# Patient Record
Sex: Female | Born: 1965 | Race: White | Hispanic: No | Marital: Married | Smoking: Former smoker
Health system: Southern US, Community
[De-identification: ages and names within clinical notes are randomized; demographics above are authoritative.]

## PROBLEM LIST (undated history)

## (undated) DIAGNOSIS — T7840XA Allergy, unspecified, initial encounter: Secondary | ICD-10-CM

## (undated) DIAGNOSIS — A071 Giardiasis [lambliasis]: Secondary | ICD-10-CM

## (undated) DIAGNOSIS — M1990 Unspecified osteoarthritis, unspecified site: Secondary | ICD-10-CM

## (undated) DIAGNOSIS — D649 Anemia, unspecified: Secondary | ICD-10-CM

## (undated) DIAGNOSIS — Z803 Family history of malignant neoplasm of breast: Secondary | ICD-10-CM

## (undated) DIAGNOSIS — K589 Irritable bowel syndrome without diarrhea: Secondary | ICD-10-CM

## (undated) DIAGNOSIS — K219 Gastro-esophageal reflux disease without esophagitis: Secondary | ICD-10-CM

## (undated) DIAGNOSIS — K222 Esophageal obstruction: Secondary | ICD-10-CM

## (undated) DIAGNOSIS — K52832 Lymphocytic colitis: Secondary | ICD-10-CM

## (undated) DIAGNOSIS — A498 Other bacterial infections of unspecified site: Secondary | ICD-10-CM

## (undated) HISTORY — DX: Unspecified osteoarthritis, unspecified site: M19.90

## (undated) HISTORY — DX: Allergy, unspecified, initial encounter: T78.40XA

## (undated) HISTORY — DX: Giardiasis (lambliasis): A07.1

## (undated) HISTORY — DX: Anemia, unspecified: D64.9

## (undated) HISTORY — DX: Gastro-esophageal reflux disease without esophagitis: K21.9

## (undated) HISTORY — DX: Family history of malignant neoplasm of breast: Z80.3

## (undated) HISTORY — DX: Other bacterial infections of unspecified site: A49.8

## (undated) HISTORY — DX: Irritable bowel syndrome, unspecified: K58.9

## (undated) HISTORY — DX: Lymphocytic colitis: K52.832

## (undated) HISTORY — DX: Esophageal obstruction: K22.2

---

## 1998-02-14 ENCOUNTER — Other Ambulatory Visit: Admission: RE | Admit: 1998-02-14 | Discharge: 1998-02-14 | Payer: Self-pay | Admitting: Obstetrics and Gynecology

## 2001-10-24 ENCOUNTER — Inpatient Hospital Stay (HOSPITAL_COMMUNITY): Admission: RE | Admit: 2001-10-24 | Discharge: 2001-10-24 | Payer: Self-pay | Admitting: Obstetrics & Gynecology

## 2001-10-24 ENCOUNTER — Encounter: Payer: Self-pay | Admitting: Obstetrics & Gynecology

## 2001-10-25 ENCOUNTER — Inpatient Hospital Stay (HOSPITAL_COMMUNITY): Admission: RE | Admit: 2001-10-25 | Discharge: 2001-10-25 | Payer: Self-pay | Admitting: Obstetrics & Gynecology

## 2001-11-19 ENCOUNTER — Encounter: Payer: Self-pay | Admitting: Obstetrics & Gynecology

## 2001-11-19 ENCOUNTER — Ambulatory Visit (HOSPITAL_COMMUNITY): Admission: RE | Admit: 2001-11-19 | Discharge: 2001-11-19 | Payer: Self-pay | Admitting: Obstetrics & Gynecology

## 2001-11-22 ENCOUNTER — Ambulatory Visit (HOSPITAL_COMMUNITY): Admission: RE | Admit: 2001-11-22 | Discharge: 2001-11-22 | Payer: Self-pay | Admitting: Obstetrics and Gynecology

## 2001-11-22 ENCOUNTER — Encounter: Payer: Self-pay | Admitting: Obstetrics and Gynecology

## 2002-01-31 ENCOUNTER — Inpatient Hospital Stay (HOSPITAL_COMMUNITY): Admission: AD | Admit: 2002-01-31 | Discharge: 2002-01-31 | Payer: Self-pay | Admitting: Obstetrics and Gynecology

## 2002-05-26 ENCOUNTER — Encounter: Admission: RE | Admit: 2002-05-26 | Discharge: 2002-05-26 | Payer: Self-pay | Admitting: *Deleted

## 2002-05-26 ENCOUNTER — Encounter: Payer: Self-pay | Admitting: *Deleted

## 2002-06-17 ENCOUNTER — Inpatient Hospital Stay (HOSPITAL_COMMUNITY): Admission: AD | Admit: 2002-06-17 | Discharge: 2002-06-17 | Payer: Self-pay | Admitting: Obstetrics and Gynecology

## 2002-07-01 DIAGNOSIS — A498 Other bacterial infections of unspecified site: Secondary | ICD-10-CM

## 2002-07-01 HISTORY — DX: Other bacterial infections of unspecified site: A49.8

## 2003-11-22 ENCOUNTER — Inpatient Hospital Stay (HOSPITAL_COMMUNITY): Admission: AD | Admit: 2003-11-22 | Discharge: 2003-11-22 | Payer: Self-pay | Admitting: Obstetrics and Gynecology

## 2003-11-26 ENCOUNTER — Inpatient Hospital Stay (HOSPITAL_COMMUNITY): Admission: AD | Admit: 2003-11-26 | Discharge: 2003-11-26 | Payer: Self-pay | Admitting: Obstetrics & Gynecology

## 2003-11-27 ENCOUNTER — Inpatient Hospital Stay (HOSPITAL_COMMUNITY): Admission: AD | Admit: 2003-11-27 | Discharge: 2003-11-30 | Payer: Self-pay | Admitting: Obstetrics & Gynecology

## 2003-11-27 ENCOUNTER — Encounter (INDEPENDENT_AMBULATORY_CARE_PROVIDER_SITE_OTHER): Payer: Self-pay | Admitting: Specialist

## 2003-12-01 ENCOUNTER — Encounter: Admission: RE | Admit: 2003-12-01 | Discharge: 2003-12-31 | Payer: Self-pay | Admitting: Obstetrics and Gynecology

## 2004-01-31 ENCOUNTER — Encounter: Admission: RE | Admit: 2004-01-31 | Discharge: 2004-03-01 | Payer: Self-pay | Admitting: Obstetrics and Gynecology

## 2004-03-02 ENCOUNTER — Encounter: Admission: RE | Admit: 2004-03-02 | Discharge: 2004-04-01 | Payer: Self-pay | Admitting: Obstetrics and Gynecology

## 2004-05-02 ENCOUNTER — Encounter: Admission: RE | Admit: 2004-05-02 | Discharge: 2004-06-01 | Payer: Self-pay | Admitting: Obstetrics and Gynecology

## 2007-05-04 ENCOUNTER — Encounter: Admission: RE | Admit: 2007-05-04 | Discharge: 2007-05-04 | Payer: Self-pay | Admitting: Family Medicine

## 2007-10-24 ENCOUNTER — Emergency Department (HOSPITAL_COMMUNITY): Admission: EM | Admit: 2007-10-24 | Discharge: 2007-10-24 | Payer: Self-pay | Admitting: Emergency Medicine

## 2007-11-03 ENCOUNTER — Ambulatory Visit: Payer: Self-pay | Admitting: Gastroenterology

## 2007-11-10 ENCOUNTER — Telehealth: Payer: Self-pay | Admitting: Gastroenterology

## 2007-12-01 DIAGNOSIS — R197 Diarrhea, unspecified: Secondary | ICD-10-CM | POA: Insufficient documentation

## 2007-12-01 DIAGNOSIS — K219 Gastro-esophageal reflux disease without esophagitis: Secondary | ICD-10-CM | POA: Insufficient documentation

## 2007-12-01 DIAGNOSIS — K589 Irritable bowel syndrome without diarrhea: Secondary | ICD-10-CM | POA: Insufficient documentation

## 2008-03-08 ENCOUNTER — Ambulatory Visit: Payer: Self-pay | Admitting: Gastroenterology

## 2008-05-30 ENCOUNTER — Ambulatory Visit: Payer: Self-pay | Admitting: Gastroenterology

## 2008-05-30 DIAGNOSIS — K591 Functional diarrhea: Secondary | ICD-10-CM

## 2008-05-31 LAB — CONVERTED CEMR LAB
Sed Rate: 13 mm/hr (ref 0–22)
TSH: 0.82 microintl units/mL (ref 0.35–5.50)

## 2008-06-02 LAB — CONVERTED CEMR LAB: Tissue Transglutaminase Ab, IgA: 0.2 units (ref <7)

## 2008-07-19 ENCOUNTER — Ambulatory Visit: Payer: Self-pay | Admitting: Gastroenterology

## 2008-07-19 ENCOUNTER — Encounter (INDEPENDENT_AMBULATORY_CARE_PROVIDER_SITE_OTHER): Payer: Self-pay

## 2008-08-02 ENCOUNTER — Encounter: Payer: Self-pay | Admitting: Gastroenterology

## 2008-08-02 ENCOUNTER — Ambulatory Visit: Payer: Self-pay | Admitting: Gastroenterology

## 2008-08-03 ENCOUNTER — Telehealth (INDEPENDENT_AMBULATORY_CARE_PROVIDER_SITE_OTHER): Payer: Self-pay

## 2008-08-08 ENCOUNTER — Encounter: Payer: Self-pay | Admitting: Gastroenterology

## 2008-08-11 ENCOUNTER — Encounter: Payer: Self-pay | Admitting: Gastroenterology

## 2008-08-11 ENCOUNTER — Telehealth (INDEPENDENT_AMBULATORY_CARE_PROVIDER_SITE_OTHER): Payer: Self-pay

## 2008-09-02 ENCOUNTER — Ambulatory Visit: Payer: Self-pay | Admitting: Gastroenterology

## 2008-09-02 DIAGNOSIS — K5289 Other specified noninfective gastroenteritis and colitis: Secondary | ICD-10-CM

## 2008-11-11 ENCOUNTER — Encounter: Payer: Self-pay | Admitting: Gastroenterology

## 2008-12-23 ENCOUNTER — Encounter: Payer: Self-pay | Admitting: Gastroenterology

## 2009-03-01 DIAGNOSIS — K222 Esophageal obstruction: Secondary | ICD-10-CM

## 2009-03-01 HISTORY — DX: Esophageal obstruction: K22.2

## 2009-03-13 ENCOUNTER — Telehealth: Payer: Self-pay | Admitting: Gastroenterology

## 2009-03-13 ENCOUNTER — Ambulatory Visit: Payer: Self-pay | Admitting: Gastroenterology

## 2009-03-13 DIAGNOSIS — R110 Nausea: Secondary | ICD-10-CM

## 2009-03-13 DIAGNOSIS — R141 Gas pain: Secondary | ICD-10-CM | POA: Insufficient documentation

## 2009-03-13 DIAGNOSIS — R142 Eructation: Secondary | ICD-10-CM

## 2009-03-13 DIAGNOSIS — R1319 Other dysphagia: Secondary | ICD-10-CM

## 2009-03-13 DIAGNOSIS — R143 Flatulence: Secondary | ICD-10-CM

## 2009-03-15 ENCOUNTER — Encounter: Payer: Self-pay | Admitting: Gastroenterology

## 2009-03-15 ENCOUNTER — Ambulatory Visit: Payer: Self-pay | Admitting: Gastroenterology

## 2009-03-16 ENCOUNTER — Encounter (INDEPENDENT_AMBULATORY_CARE_PROVIDER_SITE_OTHER): Payer: Self-pay

## 2009-03-17 ENCOUNTER — Encounter: Payer: Self-pay | Admitting: Gastroenterology

## 2009-04-11 ENCOUNTER — Ambulatory Visit: Payer: Self-pay | Admitting: Gastroenterology

## 2009-04-11 DIAGNOSIS — K222 Esophageal obstruction: Secondary | ICD-10-CM | POA: Insufficient documentation

## 2009-05-08 ENCOUNTER — Telehealth: Payer: Self-pay | Admitting: Gastroenterology

## 2009-10-24 ENCOUNTER — Emergency Department (HOSPITAL_COMMUNITY): Admission: EM | Admit: 2009-10-24 | Discharge: 2009-10-24 | Payer: Self-pay | Admitting: Emergency Medicine

## 2009-10-25 ENCOUNTER — Telehealth: Payer: Self-pay | Admitting: Gastroenterology

## 2010-07-11 ENCOUNTER — Telehealth: Payer: Self-pay | Admitting: Gastroenterology

## 2010-08-02 NOTE — Progress Notes (Signed)
Summary: Medication refill  Phone Note Call from Patient Call back at Home Phone [phone]   Caller: Patient Call For: Dr.Karee Christopherson Reason for Call: Refill Medication Summary of Call: Needs her Omeprazole refilled...[facility].Marland KitchenMarland KitchenMarland KitchenAppt. sch'd on 08-14-10 Initial call taken by: Karna Christmas,  July 11, 2010 10:10 AM  Follow-up for Phone Call        Rx was sent to pts pharmacy and pt notified to keep her appt for any further refills. Follow-up by: Christie Nottingham CMA Duncan Dull),  July 11, 2010 11:51 AM    Prescriptions: OMEPRAZOLE 20 MG  CPDR (OMEPRAZOLE) 1 each day 30 minutes before meal  #30 x 0   Entered by:   Christie Nottingham CMA (AAMA)   Authorized by:   Meryl Dare MD [facility]   Signed by:   Christie Nottingham CMA (AAMA) on 07/11/2010   Method used:   [facility] [address]       [address]       [place], [place]  [address]       Ph: [phone] or [phone]       Fax: [phone]   RxID:   [id number]

## 2010-08-02 NOTE — Progress Notes (Signed)
Summary: nausea meds  Phone Note Call from Patient Call back at [phone]   Caller: Patient Call For: Dr. Russella Dar Reason for Call: Talk to Nurse Summary of Call: 1. is there anything pt can be rx'ed for nausea... pt reports being on abx for cold and abx has "messed with her stomach"... pt about to be put on another round of abx for an animal bite and worried that nausea will worsen... would like to take a nausea medication while having to take abx 2. also, is there an abx that Dr. Russella Dar would most recommend for pt to take that would affect her GI tract the least that she can suggest to her ER doctor Initial call taken by: Vallarie Mare,  October 25, 2009 9:15 AM  Follow-up for Phone Call        Patient  hasn't started on antibiotics she feels that she will get nauseated when she starts clarithromycin perscribed for the animal bite she got.  She has to see her primary care MD today to have stitches placed to close the bite.  I have advised her she should check with her primary care MD for nausea meds.  She is advised she should take a probiotic while on antibiotics. Follow-up by: Darcey Nora RN, CGRN,  October 25, 2009 9:37 AM  Additional Follow-up for Phone Call Additional follow up Details #1::        Patient  still hasn't started on her clindamycin prescribed by the ER mD for a horse bite.  Patient  is concerned that this will aggrevate her hx of lymphocytic colitis.  She would like a GI MD to review and advise if this is ok to take with her hx of lymphocytic colitis.  Dr Jarold Motto you are MD of the day please advise. Additional Follow-up by: Darcey Nora RN, CGRN,  October 27, 2009 10:14 AM    Additional Follow-up for Phone Call Additional follow up Details #2::    Reviewed with Dr Jarold Motto.  Ok for her to start on her antibiotics.  Patient  is advised to start on Florastor also 2 three times a day while on antibiotics.  patient advised. Follow-up by: Darcey Nora RN, CGRN,  October 27, 2009 10:18  AM

## 2010-08-14 ENCOUNTER — Encounter: Payer: Self-pay | Admitting: Gastroenterology

## 2010-08-14 ENCOUNTER — Ambulatory Visit (INDEPENDENT_AMBULATORY_CARE_PROVIDER_SITE_OTHER): Payer: Self-pay | Admitting: Gastroenterology

## 2010-08-14 DIAGNOSIS — K219 Gastro-esophageal reflux disease without esophagitis: Secondary | ICD-10-CM

## 2010-08-14 DIAGNOSIS — R143 Flatulence: Secondary | ICD-10-CM

## 2010-08-22 NOTE — Assessment & Plan Note (Signed)
Summary: GERD yearly follow-up, med refills   History of Present Illness Visit Type: Follow-up Visit Primary GI MD: Elie Goody MD [facility] Primary Provider: Shary Decamp, MD Requesting Provider: na Chief Complaint: Pt c/o bloating, diarrhea and nausea. Pt needs refill on Omeprazole  History of Present Illness:   Mrs Staiger returns complaining of intermittent problems with nausea, bloating, and diarrhea. Her symptoms are mild. Bloating and gas has responded well to Gas-X. Her reflux symptoms are under excellent control.   GI Review of Systems    Reports bloating and  nausea.      Denies abdominal pain, acid reflux, belching, chest pain, dysphagia with liquids, dysphagia with solids, heartburn, loss of appetite, vomiting, vomiting blood, weight loss, and  weight gain.        Denies anal fissure, black tarry stools, change in bowel habit, constipation, diarrhea, diverticulosis, fecal incontinence, heme positive stool, hemorrhoids, irritable bowel syndrome, jaundice, light color stool, liver problems, rectal bleeding, and  rectal pain.   Current Medications (verified): 1)  Prozac 20 Mg Caps (Fluoxetine Hcl) .Marland Kitchen.. 1 Tablet By Mouth Once Daily 2)  Alprazolam 0.5 Mg Tabs (Alprazolam) .... 1/2 As Needed At Bedtime 3)  Omeprazole 20 Mg  Cpdr (Omeprazole) .Marland Kitchen.. 1 Each Day 30 Minutes Before Meal  Allergies (verified): 1)  Penicillin 2)  Sulfa 3)  Phenergan  Past History:  Past Medical History: GERD Irritable Bowel Syndrome C. Difficile in 2004 Lymphocytic colitis Esophageal stricture, 03/2009  Past Surgical History: Reviewed history from 04/11/2009 and no changes required. C-section x 1  Family History: Reviewed history from 04/11/2009 and no changes required. No FH of Colon Cancer: Family History of Breast Cancer: Maternal Aunt x 2, Maternal Grandmother Family History of Diabetes: Father  Social History: Reviewed history from 04/11/2009 and no changes  required. Married Daily Caffeine Use-2 cups daily Illicit Drug Use - no Patient is a former smoker. -stopped 18 years ago Alcohol Use - yes-4 glasses per week Patient does not get regular exercise.   Review of Systems  The patient denies allergy/sinus, anemia, anxiety-new, arthritis/joint pain, back pain, blood in urine, breast changes/lumps, change in vision, confusion, cough, coughing up blood, depression-new, fainting, fatigue, fever, headaches-new, hearing problems, heart murmur, heart rhythm changes, itching, menstrual pain, muscle pains/cramps, night sweats, nosebleeds, pregnancy symptoms, shortness of breath, skin rash, sleeping problems, sore throat, swelling of feet/legs, swollen lymph glands, thirst - excessive , urination - excessive , urination changes/pain, urine leakage, vision changes, and voice change.    Vital Signs:  Patient profile:   45 year old female Height:      60 inches Weight:      107 pounds BMI:     20.97 BSA:     1.43 Pulse rate:   88 / minute Pulse rhythm:   regular BP sitting:   110 / 64  (left arm) Cuff size:   regular  Vitals Entered By: Ok Anis CMA (August 14, 2010 3:45 PM)  Physical Exam  General:  Well developed, well nourished, no acute distress. Head:  Normocephalic and atraumatic. Eyes:  PERRLA, no icterus. Mouth:  No deformity or lesions, dentition normal. Lungs:  Clear throughout to auscultation. Heart:  Regular rate and rhythm; no murmurs, rubs,  or bruits. Abdomen:  Soft, nontender and nondistended. No masses, hepatosplenomegaly or hernias noted. Normal bowel sounds. Psych:  Alert and cooperative. Normal mood and affect.  Impression & Recommendations:  Problem # 1:  GERD (ICD-530.81) Continue omeprazole 20 mg daily, and standard antireflux measures.  Problem # 2:  IBS (ICD-564.1) Intermittent bloating, nausea and diarrhea, likely related to irritable bowel syndrome. Begin a low gas diet and increase Gas-X to q.i.d. p.r.n. Her  symptoms are not fully controlled consider a probiotic and/or an anti-spasmodic. If her diarrhea worsens consider a relapse of lymphocytic colitis and treat accordingly.  Patient Instructions: 1)  Omeprazole has been sent to your pharmacy.  2)  Start Gas-X four times a day as needed. 3)  Excessive Gas Diet handout given.  4)  Copy sent to : Shary Decamp, MD 5)  The medication list was reviewed and reconciled.  All changed / newly prescribed medications were explained.  A complete medication list was provided to the patient / caregiver.  Prescriptions: OMEPRAZOLE 20 MG  CPDR (OMEPRAZOLE) 1 each day 30 minutes before meal  #30 x 11   Entered by:   Christie Nottingham CMA (AAMA)   Authorized by:   Meryl Dare MD [facility]   Signed by:   Christie Nottingham CMA (AAMA) on 08/14/2010   Method used:   [facility]        [facility]  [place] [address] [facility]       [address]       [place], [place]  [address]       Ph: [phone] or [phone]       Fax: [phone]   RxID:   [id number]

## 2010-11-13 NOTE — Assessment & Plan Note (Signed)
[facility]                         GASTROENTEROLOGY OFFICE NOTE   NAME:Joanne Robertson, Joanne Robertson                       MRN:          [id number]  DATE:11/03/2007                            DOB:          1966/03/12    REFERRING PHYSICIAN:  Duncan Dull, M.D.   REASON FOR CONSULTATION:  Diarrhea.   HISTORY OF PRESENT ILLNESS:  This is a 45 year old white female who  relates a history of irritable bowel syndrome, intermittently  symptomatic since she was a teenager.  She notes diarrhea and abdominal  bloating related to stress.  She has had persistent  problems with  diarrhea for the past six weeks since returning from [place].  She  adopted a child this winter and took two trips to [place].  Approximately  1 month after her returned from her last trip in March, she developed  frequent, watery, nonbloody diarrhea.  She also states she has lost  about 15 pounds over the past 6 months . She has frequent nausea  associated with bloating, belching, acid reflux, and epigastric pain as  well.  Stool studies from October 26, 2007, were negative for enteric  pathogens, ova and parasites, and C. difficile toxins A and B.  A CBC,  metabolic panel, and erythrocyte sedimentation rate from October 23, 2007,  were unremarkable except for a mildly low albumin at 3.3.  She states  she took a course of antibiotics for a sinus infection in February.  She  has not had any dietary changes and notes no particular foods that  exacerbate her symptoms.  Her newly adopted child is a 54-month-old  girl, and she also has a 58-year-old son.  She was started on Nexium with  good control of her reflux symptoms.   FAMILY HISTORY:  Negative for colon cancer, colon polyps, and  inflammatory bowel disease.   PAST MEDICAL HISTORY:  1. C. difficile colitis in 2004.  2. Irritable bowel syndrome.   PAST SURGICAL HISTORY:  Negative.   CURRENT MEDICATIONS:  Listed on the chart, updated, and  reviewed.   MEDICATION ALLERGIES:  PENICILLIN, SULFA, PHENERGAN.   SOCIAL HISTORY:  Per the handwritten form.   REVIEW OF SYSTEMS:  Per the handwritten form.   PHYSICAL EXAMINATION:  GENERAL: Well-developed, well-nourished, mildly  anxious white female.  VITAL SIGNS:  Height 5 feet 1/2 inch, weight 102.6 pounds.  Blood  pressure 94/62, pulse 84 and regular.  HEENT:  Anicteric sclerae.  Oropharynx clear.  CHEST:  Clear to auscultation bilaterally.  CARDIAC:  Regular rate and rhythm without murmurs appreciated.  ABDOMEN:  Soft, nontender, nondistended.  Normoactive bowel sounds.  No  palpable organomegaly, masses, or hernias.  EXTREMITIES:  Without clubbing, cyanosis, or edema.  NEUROLOGIC:  Alert and oriented x3.  Grossly nonfocal.   ASSESSMENT AND PLAN:  1. Diarrhea in the setting of recent antibiotic usage and travel to      [place].  She also has a history compatible with diarrhea-      predominant irritable bowel syndrome, and she may be having a      flare.  We will plan  for an empiric trial of treatment for possible      Clostridium difficile and Giardia with a course of metronidazole      500 mg b.i.d. for 10 days.  If her symptoms do not completely      resolve, we will plan to obtain stool hemoccults and proceed with      colonoscopy for further evaluation.  Return office visit in 3-4      weeks.  2. Gastroesophageal reflux disease.  Maintain standard antireflux      measures and continue Nexium 40 mg p.o. q.a.m.  Return office visit      in 3-4 weeks.     Venita Lick. Russella Dar, MD, [facility]  Electronically Signed    MTS/MedQ  DD: 11/03/2007  DT: 11/03/2007  Job #: [id number]   cc:   Duncan Dull, M.D.

## 2010-11-16 NOTE — Op Note (Signed)
NAME:  Joanne Robertson, Joanne Robertson                          ACCOUNT NO.:  [id number]   MEDICAL RECORD NO.:  [id number]                   PATIENT TYPE:  INP   LOCATION:  9105                                 [facility]ILITY:  [facility]   PHYSICIAN:  Lenoard Aden, M.D.             DATE OF BIRTH:  Apr 04, 1966   DATE OF PROCEDURE:  11/27/2003  DATE OF DISCHARGE:                                 OPERATIVE REPORT   PREOPERATIVE DIAGNOSES:  Active bedrest 40+ weeks, meconium, ruptured  membranes.   POSTOPERATIVE DIAGNOSES:  Active bedrest 40+ weeks, meconium, ruptured  membranes.  Occiput posterior presentation.   PROCEDURE:  Primary low flap transverse cesarean section.   ANESTHESIA:  Epidural.   SURGEON:  Lenoard Aden, M.D.   ASSISTANT:  Richardean Sale, M.D.   ESTIMATED BLOOD LOSS:  800 cc.   COMPLICATIONS:  None.   FINDINGS:  Full-term living female, occiput posterior position.  Meconium  noted.  Apgar's 8 and 9.  DeLee suctioning done.  Normal tubes and ovaries.  Placenta to pathology.  Peds in attendance.   DISPOSITION:  The patient to recovery in good condition.   BRIEF OPERATIVE NOTE:  After being apprised of the risks of anesthesia,  infection, bleeding, injury to intra-abdominal organs and need for repair,  the patient is brought to the operating room.  She was administered an  epidural anesthetic without complications.  Prepped and draped in the usual  sterile fashion.  A Foley catheter previously placed.  After achieving  adequate level from her epidural anesthetic, dilute Marcaine solution is  placed and a Pfannenstiel skin incision is made with the scalpel.  This was  taken down to the fascia, which is nicked in the midline and opened  transversely with the Mayo scissors.  Rectus muscles are dissected sharply  in the midline.  Peritoneum entered bluntly with bladder placed.  Visceroperitoneum scored in a smile-like fashion and dissected sharply off  the lower uterine segment.  Uterus  is scored in a smile-like fashion.   Atraumatic delivery of a full-term living female from occiput posterior  position, with DeLee suctioning performed.  The infant handed to the  awaiting pediatricians in attendance.  Apgar's are 8 and 9.  Cord blood  collected.   Placenta removed from the posterior location and intact.  Three-vessel cord  noted.  The uterus is exteriorized and closed in two layers, after being  curetted with a dry lap pad.  Closed in two layers using a 0 Monocryl  suture.  Hemostasis noted and bladder flap inspected, found to be hemostatic.  The  uterus replaced into the abdominal cavity.  The fascia is closed using 0  Monocryl in continuous running fashion.  Skin closed using staples.   The patient tolerated the procedure well.  She was transferred to the  recovery room in good condition.  Lenoard Aden, M.D.    RJT/MEDQ  D:  11/27/2003  T:  11/27/2003  Job:  [id number]

## 2010-11-16 NOTE — H&P (Signed)
NAME:  Joanne Robertson, Joanne Robertson                          ACCOUNT NO.:  [id number]   MEDICAL RECORD NO.:  [id number]                   PATIENT TYPE:  INP   LOCATION:  9166                                 [facility]ILITY[facility]   PHYSICIAN:  Genia Del, M.D.             DATE OF BIRTH:  06-15-1966   DATE OF ADMISSION:  11/27/2003  DATE OF DISCHARGE:                                HISTORY & PHYSICAL   HISTORY AND PHYSICAL:  Ms. Suitt is a 45 year old G 2, P 0, A 1 at 40 weeks  and 1 day gestation, expected date of delivery Nov 26, 2003.   REASON FOR ADMISSION:  Regular uterine contractions with spontaneous rupture  of membranes at 4:15 a.m.   HISTORY OF PRESENT ILLNESS:  The patient has had prolonged ___________ phase  with frequent regular contractions over the past 48 hours with no cervical  change.  She presented a few times at [facility] Admissions.  Her spontaneous  rupture of membranes was accompanied by a mild amount of fluid that was  slightly tinted green.  Fetal movements positive.  No vaginal bleeding.  No  PIH symptoms.   PAST MEDICAL HISTORY:  Cardiac murmur, antibiotics at dentist, otherwise  negative.   PAST SURGICAL HISTORY:  Positive for extraction of wisdom teeth in 1986.   PAST GYNECOLOGIC HISTORY:  Positive for infertility.   PAST OBSTETRICAL HISTORY:  Spontaneous abortion complete, no complication,  January 2001.   PAST FAMILY HISTORY:  Positive for breast cancer and chronic hypertension.   MEDICATIONS:  Prenatal vitamins.   ALLERGIES:  PENICILLIN and SULFA DRUGS associated with rash.   SOCIAL HISTORY:  Married, nonsmoker.   HISTORY OF PRESENT PREGNANCY:  First trimester normal, hemoglobin 13,  platelets 207, O positive, antibodies negative, toxicology negative, RPR  nonreactive, HGB negative, HIV negative, rubella immune.  First trimester  screening within normal limits, declined amniocentesis for advanced maternal  age.  Ultrasound review of anatomy at 28 weeks  was within normal limits  except for a choroid plexus cyst that was resolved later on ultrasound at  27+ weeks.  One-hour GTT within normal limits.  Blood pressure has remained  normal throughout pregnancy.  Group B strep was negative at 35+ weeks.   REVIEW OF SYSTEMS:  HEENT:  Negative.  CARDIOVASCULAR/RESPIRATORY:  Negative.  GI/UROLOGIC:  Negative.  NEUROLOGIC/ENDOCRINE/DERMATOLOGIC:  Negative.   PHYSICAL EXAMINATION:  GENERAL:  In no apparent distress.  Pain with uterine  contractions.  VITAL SIGNS:  Temperature 97.8, pulse 91, respiratory rate 20, blood  pressure 140/77.  LUNGS:  Clear bilaterally.  HEART:  Regular cardiac rhythm.  PELVIC:  Gravid uterus, cephalic presentation.  Vaginal exam on admission, 1  cm dilated, 90% effaced, vertex minus 2.  Membranes ruptured with tinted  meconium, Nitrazine positive.  NEUROLOGICAL/LYMPHATICS:  Normal.  .  MONITORING:  Fetal heart rate reactive, no deceleration, baseline 140s.  Regular uterine contractions about every 3 minutes,  mild to moderate.   IMPRESSION:  A gravida 2, para 0, abortus 1 at 40 weeks and 1 day gestation  with spontaneous rupture of membranes, amniotic fluid tinted with meconium  in spontaneous labor.  Fetal well being reassuring.  Maternal cardiac murmur  requiring antibiotics.   PLAN:  Admit to Labor and Delivery, continuous monitoring, intrauterine  pressure catheter for amnioinfusion, gentamicin to cover for cardiac murmur.  Expectant management with probable vaginal delivery.                                               Genia Del, M.D.    ML/MEDQ  D:  11/27/2003  T:  11/27/2003  Job:  [id number]

## 2010-11-16 NOTE — Discharge Summary (Signed)
NAME:  Joanne Robertson, Joanne Robertson                          ACCOUNT NO.:  [id number]   MEDICAL RECORD NO.:  [id number]                   PATIENT TYPE:  INP   LOCATION:  9105                                 [facility]   PHYSICIAN:  Lenoard Aden, M.D.             DATE OF BIRTH:  May 06, 1966   DATE OF ADMISSION:  11/27/2003  DATE OF DISCHARGE:  11/30/2003                                 DISCHARGE SUMMARY   The patient underwent an uncomplicated C-section Nov 27, 2003.  Postoperative course uncomplicated.  Discharged to home postop day #3,  tolerated a regular diet well.  Discharge teaching done.  Discharged to home  on medications to include prenatal vitamins, iron, and Percocet.  Follow up  in the office in four to six weeks.                                               Lenoard Aden, M.D.    RJT/MEDQ  D:  01/05/2004  T:  01/06/2004  Job:  [id number]

## 2010-11-16 NOTE — Discharge Summary (Signed)
NAME:  Joanne Robertson, Joanne Robertson                          ACCOUNT NO.:  [id number]   MEDICAL RECORD NO.:  [id number]                   PATIENT TYPE:   LOCATION:                                       [facility]   PHYSICIAN:  Lenoard Aden, M.D.             DATE OF BIRTH:  [birth year]   DATE OF ADMISSION:  DATE OF DISCHARGE:                                 DISCHARGE SUMMARY   INDICATION FOR ADMISSION:  Active labor.   DISCHARGE DIAGNOSES:  1. Active phase arrest.  2. Primary cesarean section.   The patient underwent uncomplicated primary C-section on Nov 27, 2003.  Postoperative course uncomplicated.  Discharged to home postoperative day  #3.  Discharge teaching done.   DISCHARGE MEDICATIONS:  Prenatal vitamins, iron, Tylox.   Follow-up in the office in four weeks.   CONDITION ON DISCHARGE:  Good.  No complications.   FINAL DISCHARGE DIAGNOSES:  1. Active phase arrest.  2. Primary cesarean section.                                               Lenoard Aden, M.D.    RJT/MEDQ  D:  02/18/2004  T:  02/19/2004  Job:  [id number]

## 2011-03-26 LAB — DIFFERENTIAL
Lymphocytes Relative: 42
Lymphs Abs: 2.1
Monocytes Relative: 10
Neutro Abs: 2.4
Neutrophils Relative %: 47

## 2011-03-26 LAB — URINE MICROSCOPIC-ADD ON

## 2011-03-26 LAB — URINALYSIS, ROUTINE W REFLEX MICROSCOPIC
Glucose, UA: NEGATIVE
Hgb urine dipstick: NEGATIVE
Specific Gravity, Urine: 1.025
Specific Gravity, Urine: 1.035 — ABNORMAL HIGH
Urobilinogen, UA: 0.2
Urobilinogen, UA: 1
pH: 6

## 2011-03-26 LAB — POCT I-STAT, CHEM 8
BUN: 8
Chloride: 101
Creatinine, Ser: 1
Glucose, Bld: 84
Hemoglobin: 13.3
Potassium: 3.4 — ABNORMAL LOW
Sodium: 139

## 2011-03-26 LAB — CBC
MCV: 85.1
Platelets: 186
RBC: 4.38
WBC: 5

## 2011-05-15 ENCOUNTER — Ambulatory Visit: Payer: BC Managed Care – PPO | Admitting: Gastroenterology

## 2011-09-09 ENCOUNTER — Other Ambulatory Visit: Payer: Self-pay | Admitting: Gastroenterology

## 2011-09-09 NOTE — Telephone Encounter (Signed)
NEEDS OFFICE VISIT FOR ANY FURTHER REFILLS! 

## 2011-10-17 ENCOUNTER — Other Ambulatory Visit: Payer: Self-pay | Admitting: Gastroenterology

## 2011-10-23 ENCOUNTER — Other Ambulatory Visit: Payer: Self-pay | Admitting: Gastroenterology

## 2011-10-24 ENCOUNTER — Other Ambulatory Visit: Payer: Self-pay | Admitting: Gastroenterology

## 2012-03-10 ENCOUNTER — Encounter: Payer: Self-pay | Admitting: Internal Medicine

## 2012-03-10 ENCOUNTER — Ambulatory Visit (INDEPENDENT_AMBULATORY_CARE_PROVIDER_SITE_OTHER): Payer: BC Managed Care – PPO | Admitting: Internal Medicine

## 2012-03-10 ENCOUNTER — Ambulatory Visit (HOSPITAL_BASED_OUTPATIENT_CLINIC_OR_DEPARTMENT_OTHER)
Admission: RE | Admit: 2012-03-10 | Discharge: 2012-03-10 | Disposition: A | Discharge status: Home / Self Care | Payer: BC Managed Care – PPO | Source: Ambulatory Visit | Attending: Internal Medicine | Admitting: Internal Medicine

## 2012-03-10 ENCOUNTER — Other Ambulatory Visit: Payer: Self-pay | Admitting: Internal Medicine

## 2012-03-10 VITALS — BP 108/64 | HR 83 | Temp 97.2°F | Resp 16 | Ht 60.0 in | Wt 106.3 lb

## 2012-03-10 DIAGNOSIS — M542 Cervicalgia: Secondary | ICD-10-CM

## 2012-03-10 DIAGNOSIS — R05 Cough: Secondary | ICD-10-CM

## 2012-03-10 DIAGNOSIS — Z9189 Other specified personal risk factors, not elsewhere classified: Secondary | ICD-10-CM

## 2012-03-10 DIAGNOSIS — M5030 Other cervical disc degeneration, unspecified cervical region: Secondary | ICD-10-CM | POA: Insufficient documentation

## 2012-03-10 DIAGNOSIS — Z8742 Personal history of other diseases of the female genital tract: Secondary | ICD-10-CM

## 2012-03-10 DIAGNOSIS — J029 Acute pharyngitis, unspecified: Secondary | ICD-10-CM

## 2012-03-10 DIAGNOSIS — Z87898 Personal history of other specified conditions: Secondary | ICD-10-CM

## 2012-03-10 DIAGNOSIS — Z803 Family history of malignant neoplasm of breast: Secondary | ICD-10-CM

## 2012-03-10 DIAGNOSIS — R059 Cough, unspecified: Secondary | ICD-10-CM

## 2012-03-10 DIAGNOSIS — Z9889 Other specified postprocedural states: Secondary | ICD-10-CM

## 2012-03-10 DIAGNOSIS — Z862 Personal history of diseases of the blood and blood-forming organs and certain disorders involving the immune mechanism: Secondary | ICD-10-CM

## 2012-03-10 DIAGNOSIS — R5381 Other malaise: Secondary | ICD-10-CM

## 2012-03-10 DIAGNOSIS — R5383 Other fatigue: Secondary | ICD-10-CM

## 2012-03-10 DIAGNOSIS — A071 Giardiasis [lambliasis]: Secondary | ICD-10-CM | POA: Insufficient documentation

## 2012-03-10 LAB — CBC WITH DIFFERENTIAL/PLATELET
Eosinophils Relative: 3 % (ref 0–5)
Hemoglobin: 12.3 g/dL (ref 12.0–15.0)
Lymphocytes Relative: 43 % (ref 12–46)
Lymphs Abs: 2.4 10*3/uL (ref 0.7–4.0)
MCH: 27.8 pg (ref 26.0–34.0)
MCV: 84.9 fL (ref 78.0–100.0)
Monocytes Relative: 6 % (ref 3–12)
Neutrophils Relative %: 48 % (ref 43–77)
Platelets: 258 10*3/uL (ref 150–400)
RBC: 4.43 MIL/uL (ref 3.87–5.11)
WBC: 5.6 10*3/uL (ref 4.0–10.5)

## 2012-03-10 LAB — COMPREHENSIVE METABOLIC PANEL
ALT: 10 U/L (ref 0–35)
CO2: 27 mEq/L (ref 19–32)
Calcium: 9.7 mg/dL (ref 8.4–10.5)
Chloride: 100 mEq/L (ref 96–112)
Glucose, Bld: 74 mg/dL (ref 70–99)
Sodium: 137 mEq/L (ref 135–145)
Total Protein: 7.7 g/dL (ref 6.0–8.3)

## 2012-03-10 LAB — TSH: TSH: 0.993 u[IU]/mL (ref 0.350–4.500)

## 2012-03-10 MED ORDER — HYDROCORTISONE 2.5 % EX CREA: TOPICAL_CREAM | Freq: Two times a day (BID) | CUTANEOUS | Status: DC

## 2012-03-10 MED ORDER — DOXYCYCLINE HYCLATE 100 MG PO TABS: 100.0000 mg | ORAL_TABLET | Freq: Two times a day (BID) | ORAL | Status: AC

## 2012-03-11 ENCOUNTER — Telehealth: Payer: Self-pay | Admitting: *Deleted

## 2012-03-11 ENCOUNTER — Encounter: Payer: Self-pay | Admitting: Internal Medicine

## 2012-03-11 DIAGNOSIS — Z862 Personal history of diseases of the blood and blood-forming organs and certain disorders involving the immune mechanism: Secondary | ICD-10-CM | POA: Insufficient documentation

## 2012-03-11 DIAGNOSIS — Z87898 Personal history of other specified conditions: Secondary | ICD-10-CM | POA: Insufficient documentation

## 2012-03-11 DIAGNOSIS — Z803 Family history of malignant neoplasm of breast: Secondary | ICD-10-CM | POA: Insufficient documentation

## 2012-03-11 DIAGNOSIS — Z8742 Personal history of other diseases of the female genital tract: Secondary | ICD-10-CM | POA: Insufficient documentation

## 2012-03-11 LAB — VITAMIN D 25 HYDROXY (VIT D DEFICIENCY, FRACTURES): Vit D, 25-Hydroxy: 41 ng/mL (ref 30–89)

## 2012-03-11 NOTE — Telephone Encounter (Signed)
Spoke with pt.  And informed of Xray results.  OK for her to take Ibuprofen 600 mg bid for 14 days then stop  She voices understanding

## 2012-03-11 NOTE — Progress Notes (Signed)
Subjective:    Patient ID: Joanne Robertson, female    DOB: Apr 27, 1966, 46 y.o.   MRN: [id number]  HPI Angelica Chessman is here for first visit. To establish care.  Former primary [employer]  PMH of gerd, S/P esophageal stricture, cervical polyp excision Dr. Billy Coast, abnormal mm 3 years ago with neg biopsy per pt report, and remote history of anemia.    Angelica Chessman has several concerns .  She began with sore throat today and has itchy skin rask near R clavicle and on back.  She is an avid horse rider and is in the woods all the time.  She also recently got a new puppy in the house and startes "I'm not sure if the puppy has fleas."  She denies witnessing and ticks , fleas or spiders. No documented fever or headache.  She has a FH of auto=immune disease in mother with scleroderma and sjogren's.  She reports she had an elevated blood test but not sure what it was -  Possibly sed rate.  She is also concerned over lingering neck discomfort over 2 months ago.  She was horse-back riding and horse reared head and she jerked backwards.  Still has muscle pain.  She is worried about low blood sugar and fatigue  She also has a lingering cough off and on.  Former smoker.  Cough over 3 weeks.    She is current on mammogram done last month at [facility]  .  She reports it was fine  Had pap 8 months ago with Dr. Billy Coast and reports it was normal.  Strong FH of breast cancer in PGM, MGM and Maunts x 2.  She has not had BRCA test due to expense.  Many also reports she is quite stressed at home.  Has 2 children 1 adopted, 1 biologic and spouse not helping too much.  Not sleeping well.  Denies daily depression or anhedonia.  Review of Systems See HPI    Objective:   Physical Exam  Physical Exam  Nursing note and vitals reviewed.  Constitutional: She is oriented to person, place, and time. She appears well-developed and well-nourished.  HENT:  Head: Normocephalic and atraumatic. O/P  Reddened no exudates. Neck good ROM no  swelling Cardiovascular: Normal rate and regular rhythm. Exam reveals no gallop and no friction rub.  No murmur heard.  Pulmonary/Chest: Breath sounds normal. She has no wheezes. She has no rales.  Neurological: She is alert and oriented to person, place, and time.  Skin: Skin is warm and dry.   She has small maculopapular lesions near R clavicle with excorations from scratching.  Few spots on upper scapula. Psychiatric: She has a normal mood and affect. Her behavior is normal.             Assessment & Plan:  Pharyntitis with rash.  In setting of new puppy and frequent woodland and forest exposure,  Will empirically cover for RMSF with Doxycycline 100 mg bid for 10 days  Skin rash  May be insect bites.  HC cream 2.5% bid  Neck pain  Will check C-spine today.  Nsaid prn  Prolonged cough will check CXR    FH breast cancer strong:  Offered BRCA testing she wishes to check with insurance  FH of auto immune disease:  Will need old records.  Get ANA next visit  Situational stress  I gave number to 2 therapists CW and NG pt states she will call  Fatigue  Check chemistries, TSH , CBC  Will follow up with me in 6 weeks

## 2012-03-17 ENCOUNTER — Telehealth: Payer: Self-pay | Admitting: *Deleted

## 2012-03-17 NOTE — Telephone Encounter (Signed)
Message copied by Mathews Robinsons on Tue Mar 17, 2012 11:40 AM ------      Message from: Raechel Chute D      Created: Wed Mar 11, 2012 12:32 PM       Ok to mail to pt

## 2012-03-17 NOTE — Telephone Encounter (Signed)
Mailed lab results to pt

## 2012-04-13 ENCOUNTER — Other Ambulatory Visit: Payer: Self-pay | Admitting: Internal Medicine

## 2012-04-13 NOTE — Telephone Encounter (Signed)
Joanne Robertson  Call this pharmacy and ask if I am the proovider for the Xanax.  I do not see it on her chart

## 2012-04-20 ENCOUNTER — Other Ambulatory Visit: Payer: Self-pay | Admitting: *Deleted

## 2012-04-20 NOTE — Telephone Encounter (Signed)
[facility]. Originally prescribed by patient previous provider Dr. Shary Decamp in [place]. [facility] requested [facility] to Dr. Constance Goltz for refill.

## 2012-04-22 ENCOUNTER — Encounter: Payer: Self-pay | Admitting: Internal Medicine

## 2012-04-22 ENCOUNTER — Ambulatory Visit (INDEPENDENT_AMBULATORY_CARE_PROVIDER_SITE_OTHER): Payer: BC Managed Care – PPO | Admitting: Internal Medicine

## 2012-04-22 VITALS — BP 100/70 | HR 88 | Temp 99.3°F | Resp 18 | Wt 111.0 lb

## 2012-04-22 DIAGNOSIS — Z23 Encounter for immunization: Secondary | ICD-10-CM

## 2012-04-22 DIAGNOSIS — Z733 Stress, not elsewhere classified: Secondary | ICD-10-CM

## 2012-04-22 DIAGNOSIS — F439 Reaction to severe stress, unspecified: Secondary | ICD-10-CM

## 2012-04-22 DIAGNOSIS — Z8269 Family history of other diseases of the musculoskeletal system and connective tissue: Secondary | ICD-10-CM

## 2012-04-22 NOTE — Progress Notes (Signed)
Subjective:    Patient ID: Joanne Robertson, female    DOB: 08-May-1966, 46 y.o.   MRN: [id number]  HPI  Joanne Robertson is here for follow up  Her neck is getting better  Situational stresses  Son Will is in 3rd grade  And may need evaluation for ADD.    Pt is almost sure she has the same issues.  States her husband is more or less removed from issues with the children.    Sleeping OK  Denies depression  Allergies  Allergen Reactions  . Penicillins     REACTION: rash, itching  . Promethazine Hcl     REACTION: agitated  . Sulfonamide Derivatives     REACTION: rash, itching   Past Medical History  Diagnosis Date  . GERD (gastroesophageal reflux disease)   . IBS (irritable bowel syndrome)   . Clostridium difficile infection 2004  . Lymphocytic colitis   . Esophageal stricture 03/2009  . Giardia    Past Surgical History  Procedure Date  . Cesarean section    History   Social History  . Marital Status: Married    Spouse Name: N/A    Number of Children: N/A  . Years of Education: N/A   Occupational History  . Not on file.   Social History Main Topics  . Smoking status: Former Smoker    Types: Cigarettes  . Smokeless tobacco: Never Used  . Alcohol Use: Yes     4 glasses per week  . Drug Use: No  . Sexually Active: Yes   Other Topics Concern  . Not on file   Social History Narrative  . No narrative on file   Family History  Problem Relation Age of Onset  . Breast cancer Maternal Aunt     x 2  . Breast cancer Maternal Grandmother   . Diabetes Father    Patient Active Problem List  Diagnosis  . ESOPHAGEAL STRICTURE  . GERD  . OTH&UNSPEC NONINFECTIOUS GASTROENTERITIS&COLITIS  . IBS  . FUNCTIONAL DIARRHEA  . NAUSEA ALONE  . DYSPHAGIA  . FLATULENCE-GAS-BLOATING  . DIARRHEA  . Giardia  . History of abnormal mammogram  . History of anemia  . History of abnormal Pap smear  . History of cervical polypectomy  . Family history of breast cancer   Current  Outpatient Prescriptions on File Prior to Visit  Medication Sig Dispense Refill  . FLUoxetine (PROZAC) 20 MG tablet Take 20 mg by mouth daily.      . hydrocortisone 2.5 % cream Apply topically 2 (two) times daily.  30 g  0  . omeprazole (PRILOSEC) 20 MG capsule TAKE ONE CAPSULE BY MOUTH EVERY DAY 30 MINUTES BEFORE MEAL  30 capsule  0  . Probiotic Product (PROBIOTIC DAILY PO) Take 1 tablet by mouth daily.           Review of Systems See HPI    Objective:   Physical Exam Physical Exam  Nursing note and vitals reviewed.  Constitutional: She is oriented to person, place, and time. She appears well-developed and well-nourished.  HENT:  Head: Normocephalic and atraumatic.  Cardiovascular: Normal rate and regular rhythm. Exam reveals no gallop and no friction rub.  No murmur heard.  Pulmonary/Chest: Breath sounds normal. She has no wheezes. She has no rales.  Neurological: She is alert and oriented to person, place, and time.  Skin: Skin is warm and dry.  Psychiatric: She has a normal mood and affect. Her behavior is normal.  Assessment & Plan:  Situational stress  I gave number to  Dr. Evelene Croon and Dr. Nolen Mu.  Pt to call  FH auto-immune disorders.   Will check ANA today  Influenza today

## 2012-04-22 NOTE — Patient Instructions (Signed)
Pt to call for psychatirica eval  Dr. Evelene Croon or Dr. Nolen Mu

## 2012-04-28 ENCOUNTER — Telehealth: Payer: Self-pay | Admitting: *Deleted

## 2012-04-28 NOTE — Telephone Encounter (Signed)
Pt notified of additional information regarding BRACA testing

## 2012-04-30 ENCOUNTER — Telehealth: Payer: Self-pay | Admitting: Internal Medicine

## 2012-04-30 DIAGNOSIS — R768 Other specified abnormal immunological findings in serum: Secondary | ICD-10-CM

## 2012-04-30 NOTE — Telephone Encounter (Signed)
Spoke with pt and informed of ANA results.    Will refer to rheumatology

## 2012-05-11 ENCOUNTER — Encounter: Payer: Self-pay | Admitting: Gastroenterology

## 2012-05-11 ENCOUNTER — Encounter: Payer: Self-pay | Admitting: *Deleted

## 2012-05-11 ENCOUNTER — Telehealth: Payer: Self-pay | Admitting: Internal Medicine

## 2012-05-11 NOTE — Telephone Encounter (Signed)
Called pt regarding an appt tomorrow

## 2012-05-11 NOTE — Telephone Encounter (Signed)
Pt states she feels she has Thrush in the mouth... She would like if something can be prescribe or do she has to be seen... Please call her if there are any concerns.Marland KitchenMarland Kitchen

## 2012-05-12 ENCOUNTER — Encounter: Payer: Self-pay | Admitting: Internal Medicine

## 2012-05-12 ENCOUNTER — Ambulatory Visit (INDEPENDENT_AMBULATORY_CARE_PROVIDER_SITE_OTHER): Payer: BC Managed Care – PPO | Admitting: Internal Medicine

## 2012-05-12 VITALS — BP 109/75 | HR 80 | Temp 98.9°F | Resp 18 | Wt 109.0 lb

## 2012-05-12 DIAGNOSIS — K1379 Other lesions of oral mucosa: Secondary | ICD-10-CM

## 2012-05-12 DIAGNOSIS — K1370 Unspecified lesions of oral mucosa: Secondary | ICD-10-CM

## 2012-05-12 NOTE — Patient Instructions (Addendum)
Call me if any worsening

## 2012-05-12 NOTE — Progress Notes (Signed)
Subjective:    Patient ID: Joanne Robertson, female    DOB: February 17, 1966, 46 y.o.   MRN: [id number]  HPI  Angelica Chessman is here for acute visit.  She is wondering if she has thrush.  She felt tingling around her mouth and thought she may have an early fever blister.    She used Abreva   No cough no documented fever   Allergies  Allergen Reactions  . Penicillins     REACTION: rash, itching  . Promethazine Hcl     REACTION: agitated  . Sulfonamide Derivatives     REACTION: rash, itching   Past Medical History  Diagnosis Date  . GERD (gastroesophageal reflux disease)   . IBS (irritable bowel syndrome)   . Clostridium difficile infection 2004  . Lymphocytic colitis   . Esophageal stricture 03/2009  . Giardia    Past Surgical History  Procedure Date  . Cesarean section    History   Social History  . Marital Status: Married    Spouse Name: N/A    Number of Children: N/A  . Years of Education: N/A   Occupational History  . Not on file.   Social History Main Topics  . Smoking status: Former Smoker    Types: Cigarettes  . Smokeless tobacco: Never Used  . Alcohol Use: Yes     Comment: 4 glasses per week  . Drug Use: No  . Sexually Active: Yes   Other Topics Concern  . Not on file   Social History Narrative  . No narrative on file   Family History  Problem Relation Age of Onset  . Breast cancer Maternal Aunt     x 2  . Breast cancer Maternal Grandmother   . Diabetes Father    Patient Active Problem List  Diagnosis  . ESOPHAGEAL STRICTURE  . GERD  . OTH&UNSPEC NONINFECTIOUS GASTROENTERITIS&COLITIS  . IBS  . FUNCTIONAL DIARRHEA  . NAUSEA ALONE  . DYSPHAGIA  . FLATULENCE-GAS-BLOATING  . DIARRHEA  . Giardia  . History of abnormal mammogram  . History of anemia  . History of abnormal Pap smear  . History of cervical polypectomy  . Family history of breast cancer  . ANA positive   Current Outpatient Prescriptions on File Prior to Visit  Medication Sig Dispense  Refill  . ALPRAZolam (XANAX) 0.5 MG tablet Take 0.5 mg by mouth at bedtime as needed.      Marland Kitchen FLUoxetine (PROZAC) 20 MG tablet Take 20 mg by mouth daily.      . hydrocortisone 2.5 % cream Apply topically 2 (two) times daily.  30 g  0  . omeprazole (PRILOSEC) 20 MG capsule TAKE ONE CAPSULE BY MOUTH EVERY DAY 30 MINUTES BEFORE MEAL  30 capsule  0  . Probiotic Product (PROBIOTIC DAILY PO) Take 1 tablet by mouth daily.         Review of Systems    see HPI Objective:   Physical Exam Physical Exam  Nursing note and vitals reviewed.  Constitutional: She is oriented to person, place, and time. She appears well-developed and well-nourished.  HENT:  Careful oropharynx exam.  Dryness on lips  No ulcerations  No lesions indicative of thrush Head: Normocephalic and atraumatic.  Cardiovascular: Normal rate and regular rhythm. Exam reveals no gallop and no friction rub.  No murmur heard.  Pulmonary/Chest: Breath sounds normal. She has no wheezes. She has no rales.  Neurological: She is alert and oriented to person, place, and time.  Skin: Skin is  warm and dry.  Psychiatric: She has a normal mood and affect. Her behavior is normal.       Assessment & Plan:  Possible early viral syndrome.  Advised pt to call if any worsening  No signs of thrush

## 2012-05-15 ENCOUNTER — Telehealth: Payer: Self-pay | Admitting: *Deleted

## 2012-05-15 NOTE — Telephone Encounter (Signed)
Returned pt call left a message

## 2012-05-20 ENCOUNTER — Ambulatory Visit (INDEPENDENT_AMBULATORY_CARE_PROVIDER_SITE_OTHER): Payer: BC Managed Care – PPO | Admitting: Gastroenterology

## 2012-05-20 ENCOUNTER — Encounter: Payer: Self-pay | Admitting: Gastroenterology

## 2012-05-20 VITALS — BP 110/70 | HR 82 | Ht 60.0 in | Wt 109.4 lb

## 2012-05-20 DIAGNOSIS — K219 Gastro-esophageal reflux disease without esophagitis: Secondary | ICD-10-CM

## 2012-05-20 DIAGNOSIS — K589 Irritable bowel syndrome without diarrhea: Secondary | ICD-10-CM

## 2012-05-20 MED ORDER — OMEPRAZOLE 20 MG PO CPDR: DELAYED_RELEASE_CAPSULE | ORAL | Status: DC

## 2012-05-20 NOTE — Patient Instructions (Addendum)
We have sent the following medications to your pharmacy for you to pick up at your convenience:  Omeprazole  

## 2012-05-20 NOTE — Progress Notes (Signed)
History of Present Illness: This is a 46 year old female returning for followup of GERD and irritable bowel syndrome. Her reflux symptoms are very well controlled on standard dietary measures and daily omeprazole. She has rare episodes of mild breakthrough symptoms. She states she has slightly softer or loose her stools once every week or 2. She feels her irritable bowel syndrome is under very good control. Her mother has had several episodes of recurrent C. difficile is now felt to be cured she notes a burning soreness to her lips and mouth for the past 6 weeks without any focal lesions or other abnormalities noted.  Current Medications, Allergies, Past Medical History, Past Surgical History, Family History and Social History were reviewed in [employer] record.  Physical Exam: General: Well developed , well nourished, no acute distress Head: Normocephalic and atraumatic Eyes:  sclerae anicteric, EOMI Ears: Normal auditory acuity Mouth: No deformity or lesions Lungs: Clear throughout to auscultation Heart: Regular rate and rhythm; no murmurs, rubs or bruits Abdomen: Soft, non tender and non distended. No masses, hepatosplenomegaly or hernias noted. Normal Bowel sounds Musculoskeletal: Symmetrical with no gross deformities  Pulses:  Normal pulses noted Extremities: No clubbing, cyanosis, edema or deformities noted Neurological: Alert oriented x 4, grossly nonfocal Psychological:  Alert and cooperative. Normal mood and affect  Assessment and Recommendations:  1. GERD. Continue standard antireflux measures and omeprazole 20 mg daily.   2. Irritable bowel syndrome-D. Symptoms fairly inactive. Continue probiotics.

## 2012-09-21 ENCOUNTER — Other Ambulatory Visit: Payer: Self-pay | Admitting: Internal Medicine

## 2012-09-22 NOTE — Telephone Encounter (Signed)
Refill request

## 2013-01-04 ENCOUNTER — Telehealth: Payer: Self-pay | Admitting: *Deleted

## 2013-01-04 NOTE — Telephone Encounter (Signed)
Joanne Robertson would like a Rx for her ALPRAZolam to help her sleep. She previously received this medication from a prior physician.

## 2013-01-05 MED ORDER — ALPRAZOLAM 0.5 MG PO TABS: ORAL_TABLET | ORAL | Status: DC

## 2013-02-03 ENCOUNTER — Other Ambulatory Visit: Payer: Self-pay | Admitting: Internal Medicine

## 2013-02-03 DIAGNOSIS — Z760 Encounter for issue of repeat prescription: Secondary | ICD-10-CM

## 2013-02-03 MED ORDER — ALPRAZOLAM 0.5 MG PO TABS: ORAL_TABLET | ORAL | Status: DC

## 2013-02-15 ENCOUNTER — Encounter: Payer: Self-pay | Admitting: *Deleted

## 2013-03-21 ENCOUNTER — Other Ambulatory Visit: Payer: Self-pay | Admitting: Internal Medicine

## 2013-03-22 NOTE — Telephone Encounter (Signed)
Refill request

## 2013-05-10 ENCOUNTER — Ambulatory Visit (INDEPENDENT_AMBULATORY_CARE_PROVIDER_SITE_OTHER): Payer: BC Managed Care – PPO | Admitting: Internal Medicine

## 2013-05-10 ENCOUNTER — Encounter: Payer: Self-pay | Admitting: Internal Medicine

## 2013-05-10 VITALS — BP 118/83 | HR 72 | Temp 97.8°F | Resp 18 | Wt 113.0 lb

## 2013-05-10 DIAGNOSIS — Z0000 Encounter for general adult medical examination without abnormal findings: Secondary | ICD-10-CM

## 2013-05-10 DIAGNOSIS — K222 Esophageal obstruction: Secondary | ICD-10-CM

## 2013-05-10 DIAGNOSIS — R768 Other specified abnormal immunological findings in serum: Secondary | ICD-10-CM

## 2013-05-10 DIAGNOSIS — Z803 Family history of malignant neoplasm of breast: Secondary | ICD-10-CM

## 2013-05-10 DIAGNOSIS — Z862 Personal history of diseases of the blood and blood-forming organs and certain disorders involving the immune mechanism: Secondary | ICD-10-CM

## 2013-05-10 DIAGNOSIS — Z23 Encounter for immunization: Secondary | ICD-10-CM

## 2013-05-10 DIAGNOSIS — R894 Abnormal immunological findings in specimens from other organs, systems and tissues: Secondary | ICD-10-CM

## 2013-05-10 DIAGNOSIS — K219 Gastro-esophageal reflux disease without esophagitis: Secondary | ICD-10-CM

## 2013-05-10 LAB — POCT URINALYSIS DIPSTICK
Bilirubin, UA: NEGATIVE
Blood, UA: NEGATIVE
Glucose, UA: NEGATIVE
Ketones, UA: NEGATIVE
Leukocytes, UA: NEGATIVE
Nitrite, UA: NEGATIVE
Protein, UA: NEGATIVE
Spec Grav, UA: 1.01
Urobilinogen, UA: NEGATIVE
pH, UA: 6

## 2013-05-10 LAB — CBC WITH DIFFERENTIAL/PLATELET
Eosinophils Absolute: 0.2 10*3/uL (ref 0.0–0.7)
Lymphocytes Relative: 47 % — ABNORMAL HIGH (ref 12–46)
Lymphs Abs: 2.3 10*3/uL (ref 0.7–4.0)
MCH: 25.2 pg — ABNORMAL LOW (ref 26.0–34.0)
Neutro Abs: 2 10*3/uL (ref 1.7–7.7)
Neutrophils Relative %: 41 % — ABNORMAL LOW (ref 43–77)
Platelets: 279 10*3/uL (ref 150–400)
RBC: 4.53 MIL/uL (ref 3.87–5.11)
WBC: 4.8 10*3/uL (ref 4.0–10.5)

## 2013-05-10 NOTE — Progress Notes (Signed)
Subjective:    Patient ID: Joanne Robertson, female    DOB: [birth year], 47 y.o.   MRN: [id number]  HPI  Angelica Chessman is here for CPE  Overall doing well  She is ana positive and is seeing a rheumatologist in [place].  Dr. Imogene Burn  Had recent mm  Strong FH of breast cancer and I advised BRCA test but she cannot afford  She is wondering if she has low BP at times.  Feels shaky when  Due to eat.    Allergies  Allergen Reactions  . Penicillins     REACTION: rash, itching  . Promethazine Hcl     REACTION: agitated  . Sulfonamide Derivatives     REACTION: rash, itching   Past Medical History  Diagnosis Date  . GERD (gastroesophageal reflux disease)   . IBS (irritable bowel syndrome)   . Clostridium difficile infection 2004  . Lymphocytic colitis   . Esophageal stricture 03/2009  . Giardia    Past Surgical History  Procedure Laterality Date  . Cesarean section     History   Social History  . Marital Status: Married    Spouse Name: N/A    Number of Children: N/A  . Years of Education: N/A   Occupational History  . Not on file.   Social History Main Topics  . Smoking status: Former Smoker    Types: Cigarettes  . Smokeless tobacco: Never Used  . Alcohol Use: Yes     Comment: 4 glasses per week  . Drug Use: No  . Sexual Activity: No   Other Topics Concern  . Not on file   Social History Narrative  . No narrative on file   Family History  Problem Relation Age of Onset  . Breast cancer Maternal Aunt     x 2  . Breast cancer Maternal Grandmother   . Diabetes Father    Patient Active Problem List   Diagnosis Date Noted  . ANA positive 04/30/2012  . History of abnormal mammogram 03/11/2012  . History of anemia 03/11/2012  . History of abnormal Pap smear 03/11/2012  . History of cervical polypectomy 03/11/2012  . Family history of breast cancer 03/11/2012  . Giardia   . ESOPHAGEAL STRICTURE 04/11/2009  . NAUSEA ALONE 03/13/2009  . DYSPHAGIA 03/13/2009  .  FLATULENCE-GAS-BLOATING 03/13/2009  . OTH&UNSPEC NONINFECTIOUS GASTROENTERITIS&COLITIS 09/02/2008  . FUNCTIONAL DIARRHEA 05/30/2008  . GERD 12/01/2007  . IBS 12/01/2007  . DIARRHEA 12/01/2007   Current Outpatient Prescriptions on File Prior to Visit  Medication Sig Dispense Refill  . ALPRAZolam (XANAX) 0.5 MG tablet Take one tablet 3 times a week prn for insomnia  20 tablet  0  . FLUoxetine (PROZAC) 20 MG capsule TAKE ONE CAPSULE BY MOUTH EVERY MORNING  90 capsule  1  . omeprazole (PRILOSEC) 20 MG capsule TAKE ONE CAPSULE BY MOUTH EVERY DAY 30 MINUTES BEFORE MEAL  90 capsule  3  . Probiotic Product (PROBIOTIC DAILY PO) Take 1 tablet by mouth daily.       No current facility-administered medications on file prior to visit.      Review of Systems     Objective:   Physical Exam  Physical Exam  Nursing note and vitals reviewed.  Constitutional: She is oriented to person, place, and time. She appears well-developed and well-nourished.  HENT:  Head: Normocephalic and atraumatic.  Right Ear: Tympanic membrane and ear canal normal. No drainage. Tympanic membrane is not injected and not erythematous.  Left Ear: Tympanic membrane  and ear canal normal. No drainage. Tympanic membrane is not injected and not erythematous.  Nose: Nose normal. Right sinus exhibits no maxillary sinus tenderness and no frontal sinus tenderness. Left sinus exhibits no maxillary sinus tenderness and no frontal sinus tenderness.  Mouth/Throat: Oropharynx is clear and moist. No oral lesions. No oropharyngeal exudate.  Eyes: Conjunctivae and EOM are normal. Pupils are equal, round, and reactive to light.  Neck: Normal range of motion. Neck supple. No JVD present. Carotid bruit is not present. No mass and no thyromegaly present.  Cardiovascular: Normal rate, regular rhythm, S1 normal, S2 normal and intact distal pulses. Exam reveals no gallop and no friction rub.  No murmur heard.  Pulses:  Carotid pulses are 2+ on  the right side, and 2+ on the left side.  Dorsalis pedis pulses are 2+ on the right side, and 2+ on the left side.  No carotid bruit. No LE edema  Pulmonary/Chest: Breath sounds normal. She has no wheezes. She has no rales. She exhibits no tenderness.  Abdominal: Soft. Bowel sounds are normal. She exhibits no distension and no mass. There is no hepatosplenomegaly. There is no tenderness. There is no CVA tenderness.  Musculoskeletal: Normal range of motion.  No active synovitis to joints.  Lymphadenopathy:  She has no cervical adenopathy.  She has no axillary adenopathy.  Right: No inguinal and no supraclavicular adenopathy present.  Left: No inguinal and no supraclavicular adenopathy present.  Neurological: She is alert and oriented to person, place, and time. She has normal strength and normal reflexes. She displays no tremor. No cranial nerve deficit or sensory deficit. Coordination and gait normal.  Skin: Skin is warm and dry. No rash noted. No cyanosis. Nails show no clubbing.  Psychiatric: She has a normal mood and affect. Her speech is normal and behavior is normal. Cognition and memory are normal.          Assessment & Plan:  Health maintenance:    Flu vaccine today  Pap per GYn  Had mm    See scanned sheet  Positive ANA  Rheumatology is following  Shaking spells:   Advised to obtain  Glucometer and check when she feels shaky  GERD:  Continue meds  FH breast CA  Advised bRCA but cannot afford.   Cervical polyp

## 2013-05-10 NOTE — Patient Instructions (Signed)
Sign up for My chart if not done already  Normal blood sugar is 70-125  Normal  BP is top number less that 140  Bottom number is less than 90 on a consistent basis when checked.  BP goes up with exercise and stress  See me as needed  Get blood drawn today    Your received your flu vaccine today

## 2013-05-11 LAB — COMPREHENSIVE METABOLIC PANEL
Albumin: 4.5 g/dL (ref 3.5–5.2)
CO2: 29 mEq/L (ref 19–32)
Calcium: 9.7 mg/dL (ref 8.4–10.5)
Chloride: 100 mEq/L (ref 96–112)
Glucose, Bld: 88 mg/dL (ref 70–99)
Potassium: 4.2 mEq/L (ref 3.5–5.3)
Sodium: 137 mEq/L (ref 135–145)
Total Protein: 7.8 g/dL (ref 6.0–8.3)

## 2013-05-11 LAB — LIPID PANEL
Cholesterol: 202 mg/dL — ABNORMAL HIGH (ref 0–200)
Total CHOL/HDL Ratio: 2.5 Ratio

## 2013-05-11 LAB — VITAMIN D 25 HYDROXY (VIT D DEFICIENCY, FRACTURES): Vit D, 25-Hydroxy: 53 ng/mL (ref 30–89)

## 2013-05-13 ENCOUNTER — Telehealth: Payer: Self-pay | Admitting: Internal Medicine

## 2013-05-13 NOTE — Telephone Encounter (Signed)
Left message on mobile for pt to call office regarding lab results

## 2013-05-14 ENCOUNTER — Telehealth: Payer: Self-pay | Admitting: *Deleted

## 2013-05-17 ENCOUNTER — Other Ambulatory Visit: Payer: Self-pay | Admitting: Internal Medicine

## 2013-05-17 MED ORDER — INTEGRA 62.5-62.5-40-3 MG PO CAPS: 1.0000 | ORAL_CAPSULE | Freq: Every day | ORAL | Status: DC

## 2013-05-17 NOTE — Telephone Encounter (Signed)
Left message for pt to call office regarding labs  Monday 11/17 0820

## 2013-05-18 ENCOUNTER — Encounter: Payer: Self-pay | Admitting: *Deleted

## 2013-05-18 NOTE — Telephone Encounter (Signed)
Refill request

## 2013-05-19 NOTE — Telephone Encounter (Signed)
Called in [facility]anax to [facility]

## 2013-05-26 ENCOUNTER — Other Ambulatory Visit: Payer: Self-pay | Admitting: Internal Medicine

## 2013-05-31 NOTE — Telephone Encounter (Signed)
Refill request

## 2013-06-13 ENCOUNTER — Other Ambulatory Visit: Payer: Self-pay | Admitting: Gastroenterology

## 2013-06-14 NOTE — Telephone Encounter (Signed)
NEEDS OFFICE VISIT FOR ANY FURTHER REFILLS! 

## 2013-06-25 ENCOUNTER — Other Ambulatory Visit: Payer: Self-pay | Admitting: Gastroenterology

## 2013-07-01 HISTORY — PX: COLONOSCOPY: SHX174

## 2013-07-02 ENCOUNTER — Other Ambulatory Visit: Payer: Self-pay | Admitting: Internal Medicine

## 2013-07-20 NOTE — Telephone Encounter (Signed)
[facility] 

## 2013-08-16 ENCOUNTER — Telehealth: Payer: Self-pay | Admitting: *Deleted

## 2013-08-16 MED ORDER — ACETAZOLAMIDE 250 MG PO TABS: ORAL_TABLET | ORAL | Status: DC

## 2013-08-16 NOTE — Telephone Encounter (Signed)
Joanne Robertson wants Rx for Diamox to prevent altitude sickness on her trip.

## 2013-08-30 ENCOUNTER — Encounter: Payer: Self-pay | Admitting: Internal Medicine

## 2013-08-30 ENCOUNTER — Ambulatory Visit (INDEPENDENT_AMBULATORY_CARE_PROVIDER_SITE_OTHER): Payer: BC Managed Care – PPO | Admitting: Internal Medicine

## 2013-08-30 VITALS — BP 110/69 | HR 82 | Temp 98.5°F | Resp 16 | Wt 112.0 lb

## 2013-08-30 DIAGNOSIS — R059 Cough, unspecified: Secondary | ICD-10-CM

## 2013-08-30 DIAGNOSIS — J209 Acute bronchitis, unspecified: Secondary | ICD-10-CM

## 2013-08-30 DIAGNOSIS — R05 Cough: Secondary | ICD-10-CM

## 2013-08-30 DIAGNOSIS — J029 Acute pharyngitis, unspecified: Secondary | ICD-10-CM

## 2013-08-30 MED ORDER — HYDROCOD POLST-CHLORPHEN POLST 10-8 MG/5ML PO LQCR: 5.0000 mL | Freq: Two times a day (BID) | ORAL | Status: DC | PRN

## 2013-08-30 MED ORDER — AZITHROMYCIN 250 MG PO TABS: ORAL_TABLET | ORAL | Status: DC

## 2013-08-30 NOTE — Progress Notes (Signed)
Subjective:    Patient ID: Joanne Robertson, female    DOB: [birth year], 48 y.o.   MRN: [id number]  HPI  Sore throat several days cough 2 weeks productive white sputum no fever no SOB no chest pain.  Just returned from wedding in [place].  Son sick as well.    Allergies  Allergen Reactions  . Penicillins     REACTION: rash, itching  . Promethazine Hcl     REACTION: agitated  . Sulfonamide Derivatives     REACTION: rash, itching   Past Medical History  Diagnosis Date  . GERD (gastroesophageal reflux disease)   . IBS (irritable bowel syndrome)   . Clostridium difficile infection 2004  . Lymphocytic colitis   . Esophageal stricture 03/2009  . Giardia    Past Surgical History  Procedure Laterality Date  . Cesarean section     History   Social History  . Marital Status: Married    Spouse Name: N/A    Number of Children: N/A  . Years of Education: N/A   Occupational History  . Not on file.   Social History Main Topics  . Smoking status: Former Smoker    Types: Cigarettes  . Smokeless tobacco: Never Used  . Alcohol Use: Yes     Comment: 4 glasses per week  . Drug Use: No  . Sexual Activity: No   Other Topics Concern  . Not on file   Social History Narrative  . No narrative on file   Family History  Problem Relation Age of Onset  . Breast cancer Maternal Aunt     x 2  . Breast cancer Maternal Grandmother   . Diabetes Father    Patient Active Problem List   Diagnosis Date Noted  . ANA positive 04/30/2012  . History of abnormal mammogram 03/11/2012  . History of anemia 03/11/2012  . History of abnormal Pap smear 03/11/2012  . History of cervical polypectomy 03/11/2012  . Family history of breast cancer 03/11/2012  . Giardia   . ESOPHAGEAL STRICTURE 04/11/2009  . NAUSEA ALONE 03/13/2009  . DYSPHAGIA 03/13/2009  . FLATULENCE-GAS-BLOATING 03/13/2009  . OTH&UNSPEC NONINFECTIOUS GASTROENTERITIS&COLITIS 09/02/2008  . FUNCTIONAL DIARRHEA 05/30/2008  . GERD  12/01/2007  . IBS 12/01/2007  . DIARRHEA 12/01/2007   Current Outpatient Prescriptions on File Prior to Visit  Medication Sig Dispense Refill  . ALPRAZolam (XANAX) 0.5 MG tablet TAKE 1 TABLET BY MOUTH DAILY AS NEEDED  30 tablet  0  . Fe Fum-FePoly-Vit C-Vit B3 (INTEGRA) 62.5-62.5-40-3 MG CAPS Take 1 capsule by mouth daily.  30 capsule  5  . FLUoxetine (PROZAC) 20 MG capsule TAKE ONE CAPSULE BY MOUTH EVERY MORNING  90 capsule  1  . omeprazole (PRILOSEC) 20 MG capsule TAKE ONE CAPSULE BY MOUTH EVERY DAY 30 MINUTES BEFORE MEAL  90 capsule  0  . Probiotic Product (PROBIOTIC DAILY PO) Take 1 tablet by mouth daily.      Marland Kitchen acetaZOLAMIDE (DIAMOX) 250 MG tablet Take one tablet twice a day  One day before trip. Continue bid for two days total  4 tablet  0   No current facility-administered medications on file prior to visit.      Review of Systems See HPI    Objective:   Physical Exam Physical Exam  Nursing note and vitals reviewed.  Constitutional: She is oriented to person, place, and time. She appears well-developed and well-nourished. She is cooperative.  HENT:  Head: Normocephalic and atraumatic. O/P erythema  No exudates  Nose: Mucosal edema present.  Eyes: Conjunctivae and EOM are normal. Pupils are equal, round, and reactive to light.  Neck: Neck supple.  Cardiovascular: Regular rhythm, normal heart sounds, intact distal pulses and normal pulses. Exam reveals no gallop and no friction rub.  No murmur heard.  Pulmonary/Chest: She has no wheezes. She has rhonchi. She has no rales.  Neurological: She is alert and oriented to person, place, and time.  Skin: Skin is warm and dry. No abrasion, no bruising, no ecchymosis and no rash noted. No cyanosis. Nails show no clubbing.  Psychiatric: She has a normal mood and affect. Her speech is normal and behavior is normal.            Assessment & Plan:  Pharyngitis   Z-pak  [place]ough  Tussioness  Bronchitis see above

## 2013-09-27 ENCOUNTER — Telehealth: Payer: Self-pay | Admitting: Oncology

## 2013-09-27 ENCOUNTER — Other Ambulatory Visit: Payer: Self-pay | Admitting: Gastroenterology

## 2013-09-27 NOTE — Telephone Encounter (Signed)
C/D 09/27/13 appt. 10/14/13

## 2013-10-08 ENCOUNTER — Other Ambulatory Visit: Payer: Self-pay | Admitting: Oncology

## 2013-10-08 DIAGNOSIS — Z862 Personal history of diseases of the blood and blood-forming organs and certain disorders involving the immune mechanism: Secondary | ICD-10-CM

## 2013-10-14 ENCOUNTER — Ambulatory Visit (HOSPITAL_BASED_OUTPATIENT_CLINIC_OR_DEPARTMENT_OTHER): Payer: BC Managed Care – PPO | Admitting: Oncology

## 2013-10-14 ENCOUNTER — Ambulatory Visit: Payer: BC Managed Care – PPO

## 2013-10-14 ENCOUNTER — Encounter: Payer: Self-pay | Admitting: Oncology

## 2013-10-14 ENCOUNTER — Other Ambulatory Visit (HOSPITAL_BASED_OUTPATIENT_CLINIC_OR_DEPARTMENT_OTHER): Payer: BC Managed Care – PPO

## 2013-10-14 ENCOUNTER — Telehealth: Payer: Self-pay | Admitting: Oncology

## 2013-10-14 VITALS — BP 110/70 | HR 71 | Temp 97.5°F | Resp 18 | Ht 60.0 in | Wt 110.4 lb

## 2013-10-14 DIAGNOSIS — D500 Iron deficiency anemia secondary to blood loss (chronic): Secondary | ICD-10-CM

## 2013-10-14 DIAGNOSIS — D509 Iron deficiency anemia, unspecified: Secondary | ICD-10-CM

## 2013-10-14 DIAGNOSIS — Z862 Personal history of diseases of the blood and blood-forming organs and certain disorders involving the immune mechanism: Secondary | ICD-10-CM

## 2013-10-14 LAB — IRON AND TIBC CHCC
%SAT: 5 % — AB (ref 21–57)
IRON: 23 ug/dL — AB (ref 41–142)
TIBC: 440 ug/dL (ref 236–444)
UIBC: 417 ug/dL — ABNORMAL HIGH (ref 120–384)

## 2013-10-14 LAB — CBC WITH DIFFERENTIAL/PLATELET
BASO%: 0.7 % (ref 0.0–2.0)
BASOS ABS: 0 10*3/uL (ref 0.0–0.1)
EOS%: 3.9 % (ref 0.0–7.0)
Eosinophils Absolute: 0.2 10*3/uL (ref 0.0–0.5)
HCT: 33.6 % — ABNORMAL LOW (ref 34.8–46.6)
HGB: 10.8 g/dL — ABNORMAL LOW (ref 11.6–15.9)
LYMPH%: 43.8 % (ref 14.0–49.7)
MCH: 26 pg (ref 25.1–34.0)
MCHC: 32.2 g/dL (ref 31.5–36.0)
MCV: 80.8 fL (ref 79.5–101.0)
MONO#: 0.3 10*3/uL (ref 0.1–0.9)
MONO%: 8.4 % (ref 0.0–14.0)
NEUT#: 1.8 10*3/uL (ref 1.5–6.5)
NEUT%: 43.2 % (ref 38.4–76.8)
Platelets: 222 10*3/uL (ref 145–400)
RBC: 4.16 10*6/uL (ref 3.70–5.45)
RDW: 16.1 % — AB (ref 11.2–14.5)
WBC: 4.1 10*3/uL (ref 3.9–10.3)
lymph#: 1.8 10*3/uL (ref 0.9–3.3)

## 2013-10-14 LAB — COMPREHENSIVE METABOLIC PANEL (CC13)
ALK PHOS: 63 U/L (ref 40–150)
ALT: 9 U/L (ref 0–55)
AST: 18 U/L (ref 5–34)
Albumin: 3.9 g/dL (ref 3.5–5.0)
Anion Gap: 6 mEq/L (ref 3–11)
BUN: 11.8 mg/dL (ref 7.0–26.0)
CALCIUM: 9.6 mg/dL (ref 8.4–10.4)
CHLORIDE: 102 meq/L (ref 98–109)
CO2: 28 mEq/L (ref 22–29)
Creatinine: 0.7 mg/dL (ref 0.6–1.1)
Glucose: 80 mg/dl (ref 70–140)
POTASSIUM: 3.9 meq/L (ref 3.5–5.1)
Sodium: 136 mEq/L (ref 136–145)
Total Bilirubin: 0.44 mg/dL (ref 0.20–1.20)
Total Protein: 7.6 g/dL (ref 6.4–8.3)

## 2013-10-14 LAB — FERRITIN CHCC: Ferritin: 8 ng/ml — ABNORMAL LOW (ref 9–269)

## 2013-10-14 NOTE — Progress Notes (Signed)
Checked in new patient with no financial issues. She has not been out of the country. °

## 2013-10-14 NOTE — Progress Notes (Signed)
Please see consult note.  

## 2013-10-14 NOTE — Telephone Encounter (Signed)
[facility] APPT SCHEDULE FOR APRIL/JULY

## 2013-10-14 NOTE — Consult Note (Signed)
Reason for Referral: Iron deficiency anemia.   HPI: 48 year old woman currently of [place] she lived the majority of her life. She is a rather healthy woman with history of IBS as well as possible autoimmune disorder. She was found to have a positive ANA with 1:160 titer. She was evaluated by rheumatology and part of her laboratory data showed that she was mildly anemic with a hemoglobin of 11 and her MCV was 82. Her RDW was elevated at 15.1. Her white cell count was normal at 6.1 with a normal differential. Her platelet count was also normal. Her anemia workup showed no evidence of hemolysis with a negative DAT. Her vitamin B 12 and folic acid were normal. Her ferritin was low at 6 and her haptoglobin is normal. Her higher level was 39 with a saturation of 8%. For that reason patient was referred to me for evaluation. Clinically, she is mildly symptomatic. She does report fatigue and tiredness but has not reported any chest pain or shortness of breath. She has not reported any unusual cravings at this time. She does not report any headaches or blurry vision or double vision. She has not reported any abdominal pain or distention. He does not report any hematochezia or melena. She does have menstrual bleeding which does not characterized as heavy. She has not reported any abdominal surgery or gastric bypass operations. She have reported dietary restrictions including her red meat intake. She tried oral iron supplements but she could not tolerated and does not take it anymore.   Past Medical History  Diagnosis Date  . GERD (gastroesophageal reflux disease)   . IBS (irritable bowel syndrome)   . Clostridium difficile infection 2004  . Lymphocytic colitis   . Esophageal stricture 03/2009  . Giardia   :  Past Surgical History  Procedure Laterality Date  . Cesarean section    :  Current Outpatient Prescriptions  Medication Sig Dispense Refill  . ALPRAZolam (XANAX) 0.5 MG tablet TAKE 1 TABLET  BY MOUTH DAILY AS NEEDED  30 tablet  0  . FLUoxetine (PROZAC) 20 MG capsule TAKE ONE CAPSULE BY MOUTH EVERY MORNING  90 capsule  1  . omeprazole (PRILOSEC) 20 MG capsule TAKE ONE CAPSULE BY MOUTH EVERY DAY 30 MINUTES BEFORE MEAL  90 capsule  0  . Probiotic Product (PROBIOTIC DAILY PO) Take 1 tablet by mouth daily.       No current facility-administered medications for this visit.     Allergies  Allergen Reactions  . Penicillins     REACTION: rash, itching  . Promethazine Hcl     REACTION: agitated  . Sulfonamide Derivatives     REACTION: rash, itching  :  Family History  Problem Relation Age of Onset  . Breast cancer Maternal Aunt     x 2  . Breast cancer Maternal Grandmother   . Diabetes Father   :  History   Social History  . Marital Status: Married    Spouse Name: N/A    Number of Children: N/A  . Years of Education: N/A   Occupational History  . Not on file.   Social History Main Topics  . Smoking status: Former Smoker    Types: Cigarettes  . Smokeless tobacco: Never Used  . Alcohol Use: Yes     Comment: 4 glasses per week  . Drug Use: No  . Sexual Activity: No   Other Topics Concern  . Not on file   Social History Narrative  . No narrative  on file  :  Constitutional: negative for anorexia, chills and fevers Eyes: negative for icterus, irritation and redness Ears, nose, mouth, throat, and face: negative for epistaxis, hearing loss and hoarseness Respiratory: negative for cough, hemoptysis and wheezing Cardiovascular: negative for chest pain, exertional chest pressure/discomfort, orthopnea and palpitations Gastrointestinal: negative for abdominal pain, melena and vomiting Genitourinary:negative for dysuria, frequency and hematuria Integument/breast: negative for pruritus, rash and skin color change Hematologic/lymphatic: negative for bleeding, easy bruising and lymphadenopathy Musculoskeletal:negative for arthralgias, back pain and muscle  weakness Neurological: negative for coordination problems, dizziness and gait problems Behavioral/Psych: negative for anxiety and depression Endocrine: negative for temperature intolerance Allergic/Immunologic: negative for urticaria  Exam: Blood pressure 110/70, pulse 71, temperature 97.5 F (36.4 C), temperature source Oral, resp. rate 18, height 5' (1.524 m), weight 110 lb 6.4 oz (50.077 kg), SpO2 100.00%. General appearance: alert, cooperative and appears stated age Head: Normocephalic, without obvious abnormality, atraumatic Throat: lips, mucosa, and tongue normal; teeth and gums normal Neck: no adenopathy, no carotid bruit, no JVD, supple, symmetrical, trachea midline and thyroid not enlarged, symmetric, no tenderness/mass/nodules Back: symmetric, no curvature. ROM normal. No CVA tenderness. Resp: clear to auscultation bilaterally Chest wall: no tenderness Cardio: regular rate and rhythm, S1, S2 normal, no murmur, click, rub or gallop GI: soft, non-tender; bowel sounds normal; no masses,  no organomegaly Extremities: extremities normal, atraumatic, no cyanosis or edema Pulses: 2+ and symmetric Skin: Skin color, texture, turgor normal. No rashes or lesions Lymph nodes: Cervical, supraclavicular, and axillary nodes normal. Neurologic: Grossly normal   Recent Labs  10/14/13 1047  WBC 4.1  HGB 10.8*  HCT 33.6*  PLT 222    Assessment and Plan:   48 year old woman with iron deficiency anemia. Differential diagnosis was discussed with the patient today including iron deficiency related to menstrual blood losses versus GI blood losses. She could also have an element of nutritional deficit as well. She had had a GI workup extensively including a colonoscopy and endoscopy without any clear cut source of GI bleeding. Her iron deficiency is likely related to menstrual bleeding which have resulted in a low ferritin level and mild anemia. Options of treatments were discussed today  including oral iron supplementation which she refused at this time due to poor tolerance.  IV iron options were discussed today including Feraheme versus other preparations. Risks and benefits of this approach were discussed complications from IV iron specifically Feraheme were discussed extensively. Complications include infusion-related problems such as arthralgias, myalgias, hives, pruritus and the rarely anaphylaxis and serious drug infusion reaction. She understands these risks and willing to proceed. We will arrange for that and in the future as well as a followup in about 3 months to recheck her iron stores.  All her questions are answered today.

## 2013-10-19 ENCOUNTER — Other Ambulatory Visit: Payer: Self-pay | Admitting: Internal Medicine

## 2013-10-20 ENCOUNTER — Ambulatory Visit (HOSPITAL_BASED_OUTPATIENT_CLINIC_OR_DEPARTMENT_OTHER): Payer: BC Managed Care – PPO

## 2013-10-20 VITALS — BP 106/68 | HR 74 | Temp 97.9°F

## 2013-10-20 DIAGNOSIS — D509 Iron deficiency anemia, unspecified: Secondary | ICD-10-CM

## 2013-10-20 DIAGNOSIS — D500 Iron deficiency anemia secondary to blood loss (chronic): Secondary | ICD-10-CM

## 2013-10-20 IMAGING — CR DG CHEST 2V
2 series · 2 of 2 positions shown · non-contrast
Comparison: Chest x-ray of 09/21/2009

CLINICAL DATA: Recurrent cough and sore throat

CHEST - 2 VIEW

[w chest pa]
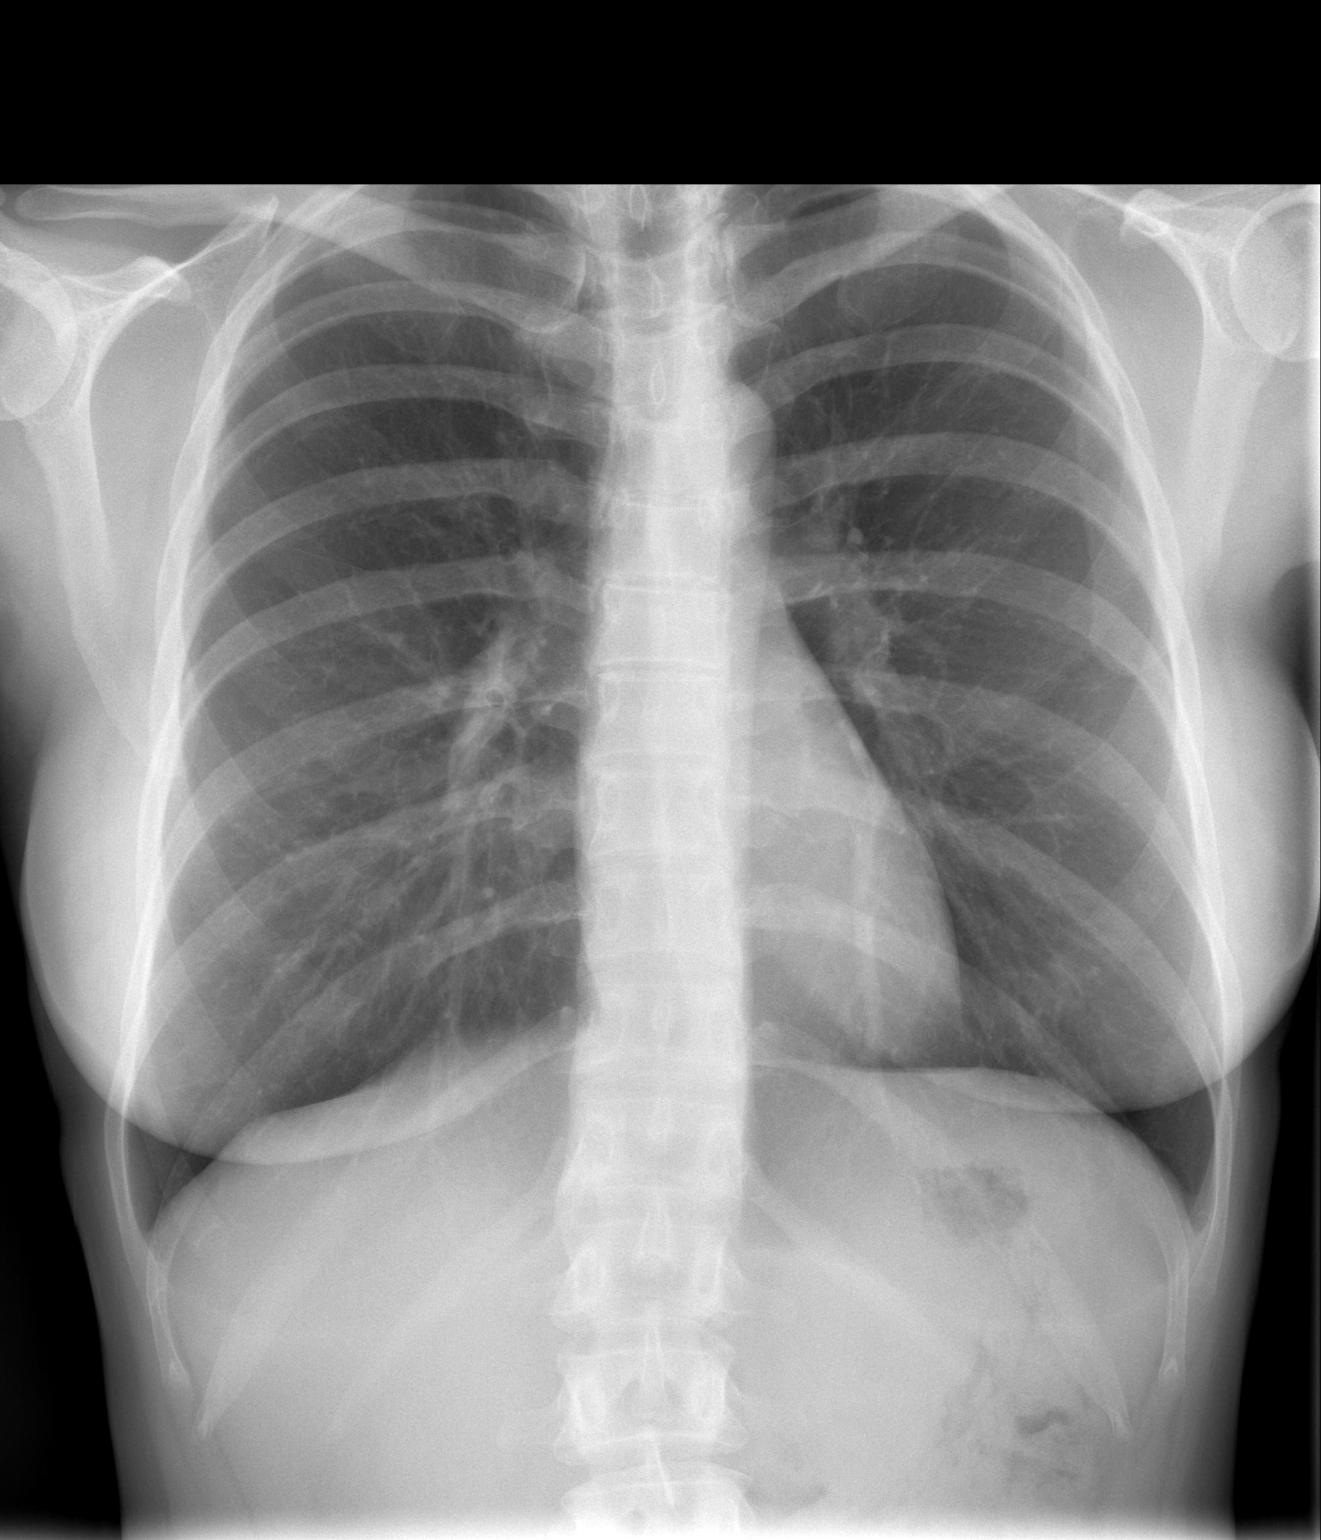

[w chest lat]
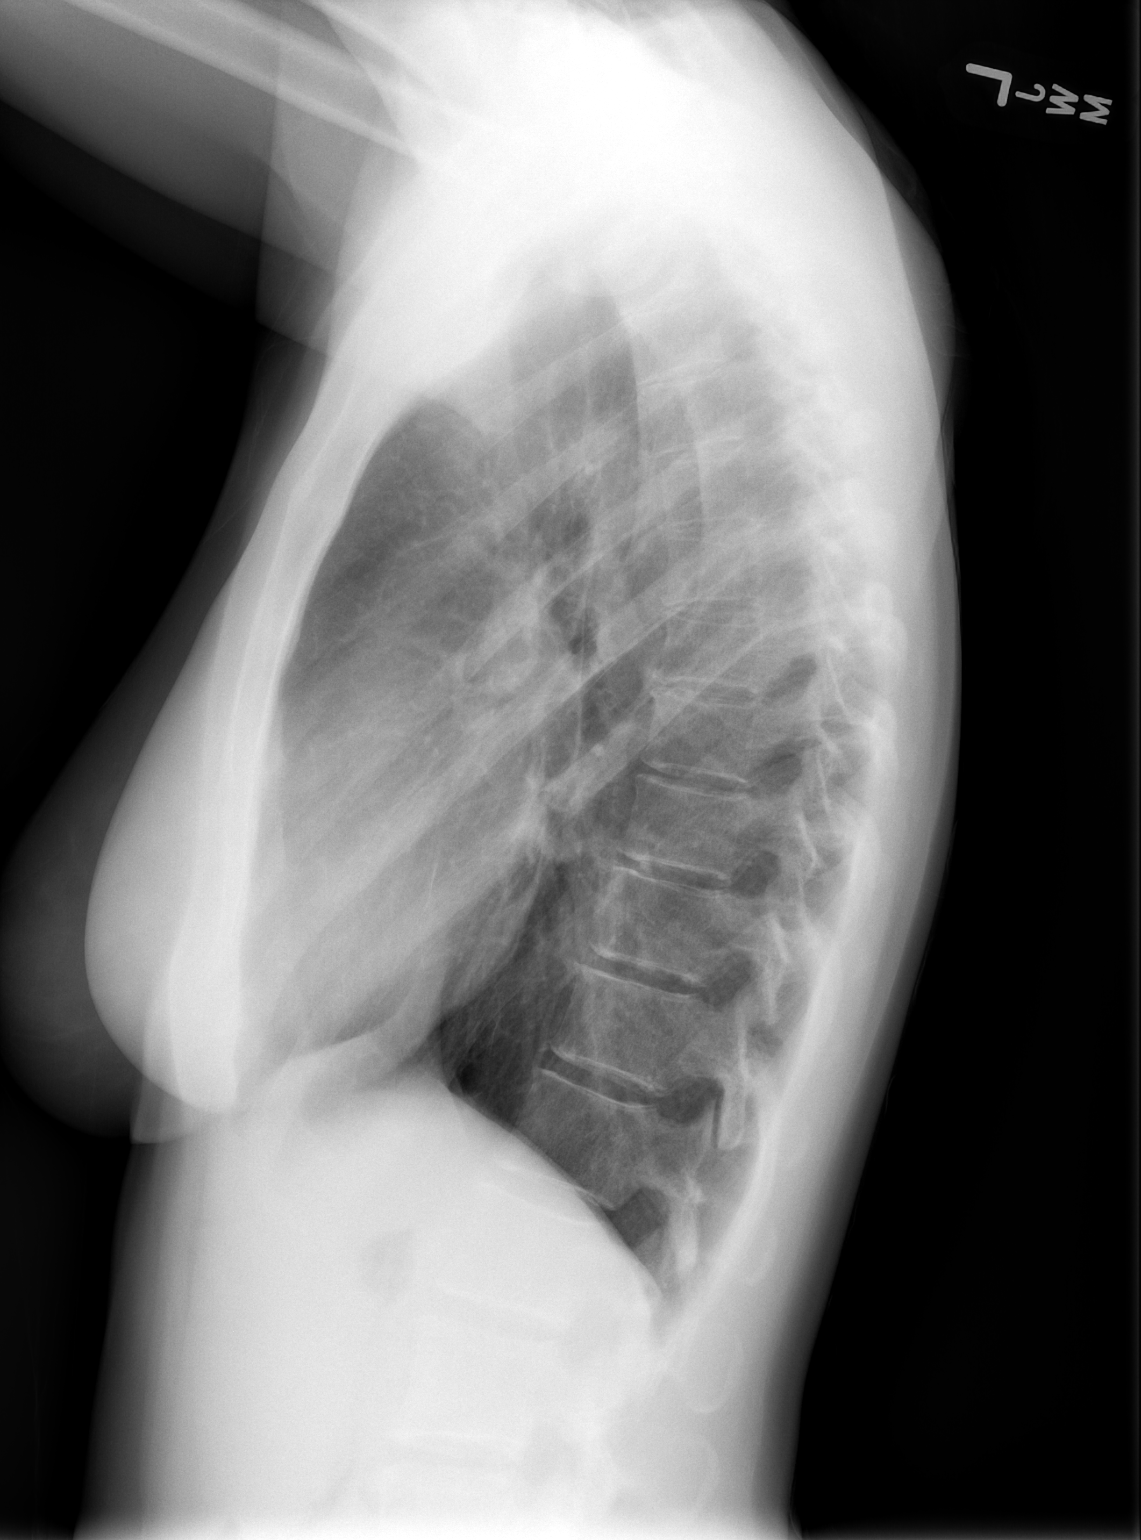

[2 of 2 positions shown; findings below may reference images not displayed]

FINDINGS: No active infiltrate or effusion is seen.  Mediastinal
contours appear stable.  The heart is within normal limits in size.
No bony abnormality is seen.
IMPRESSION: No active lung disease.

## 2013-10-20 IMAGING — CR DG CERVICAL SPINE COMPLETE 4+V
6 series · 6 of 6 positions shown · non-contrast
Comparison: None.

CLINICAL DATA: Injured horseback riding, neck pain

CERVICAL SPINE - COMPLETE 4+ VIEW

[w c-spine lat]
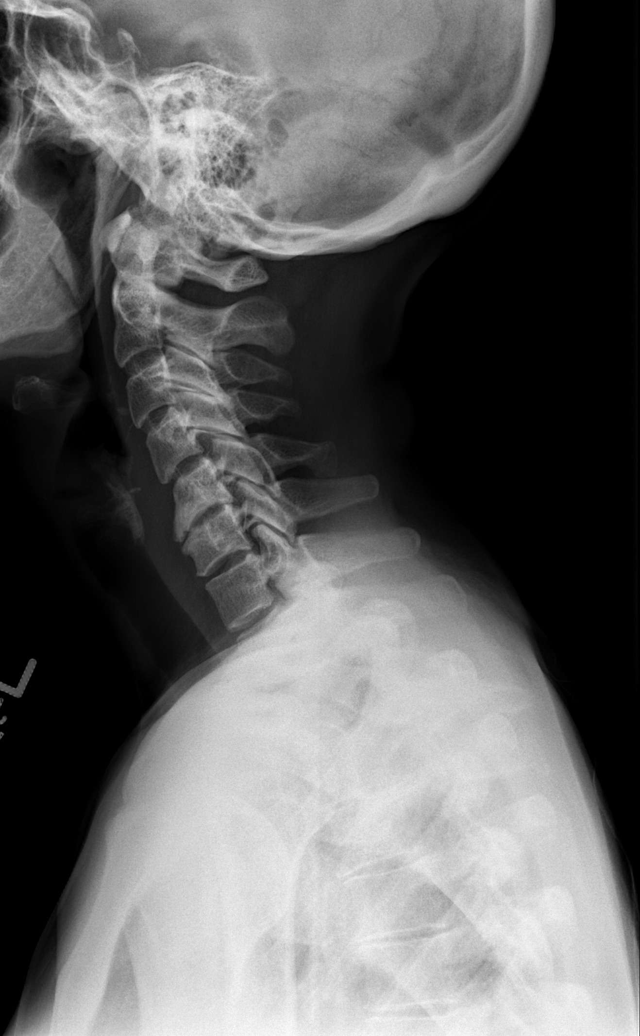

[w c-spine oblique (1 of 2)]
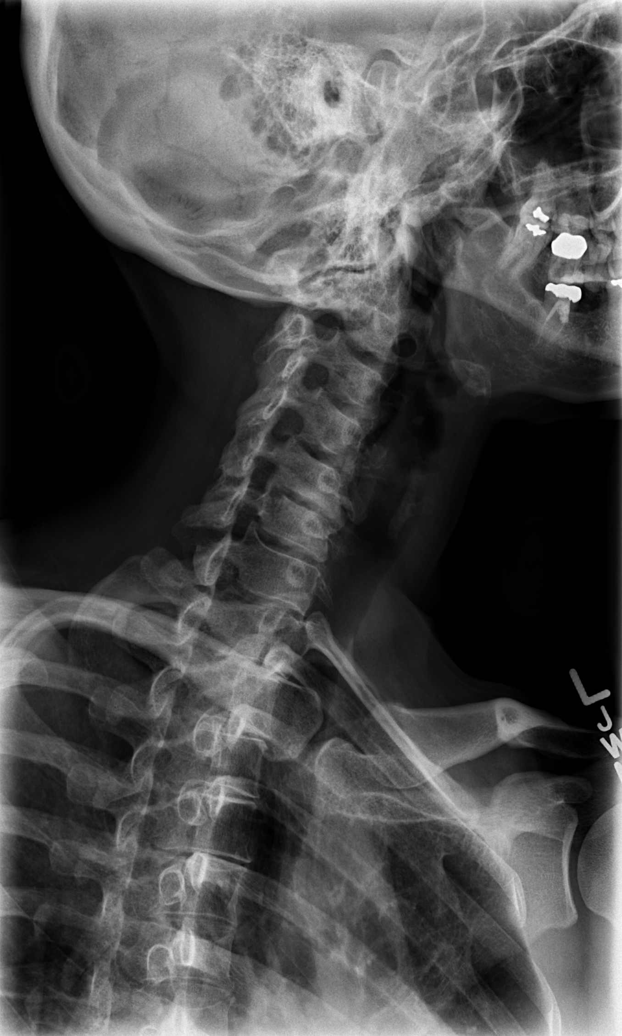

[w c-spine oblique (2 of 2)]
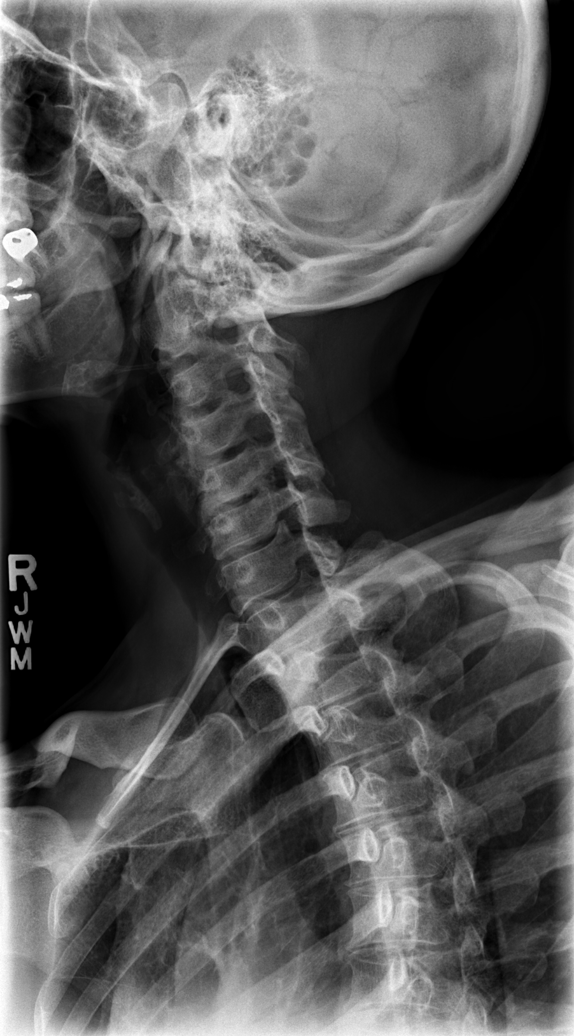

[w c-spine a.p.]
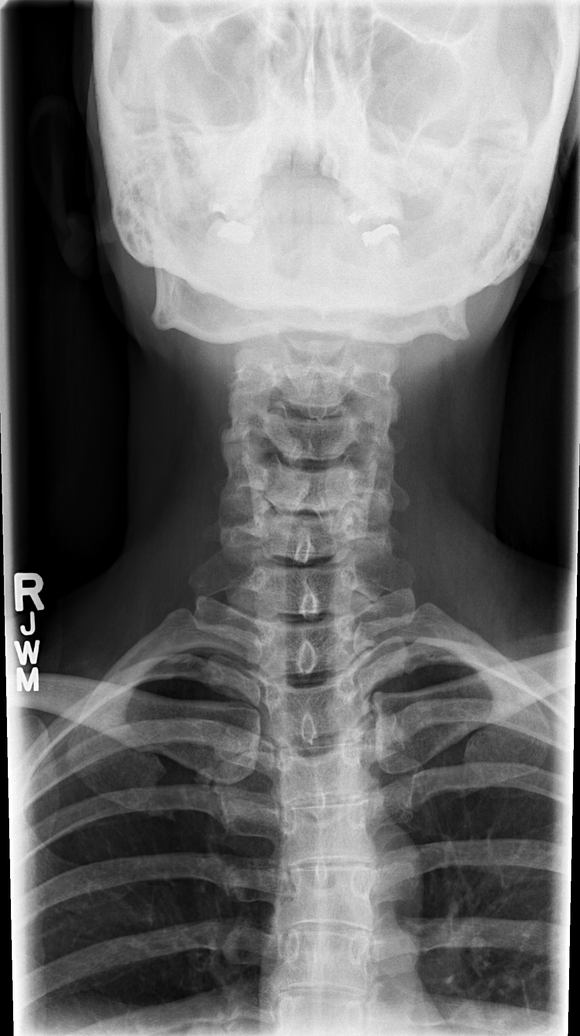

[w c-spine odontoid (1 of 2)]
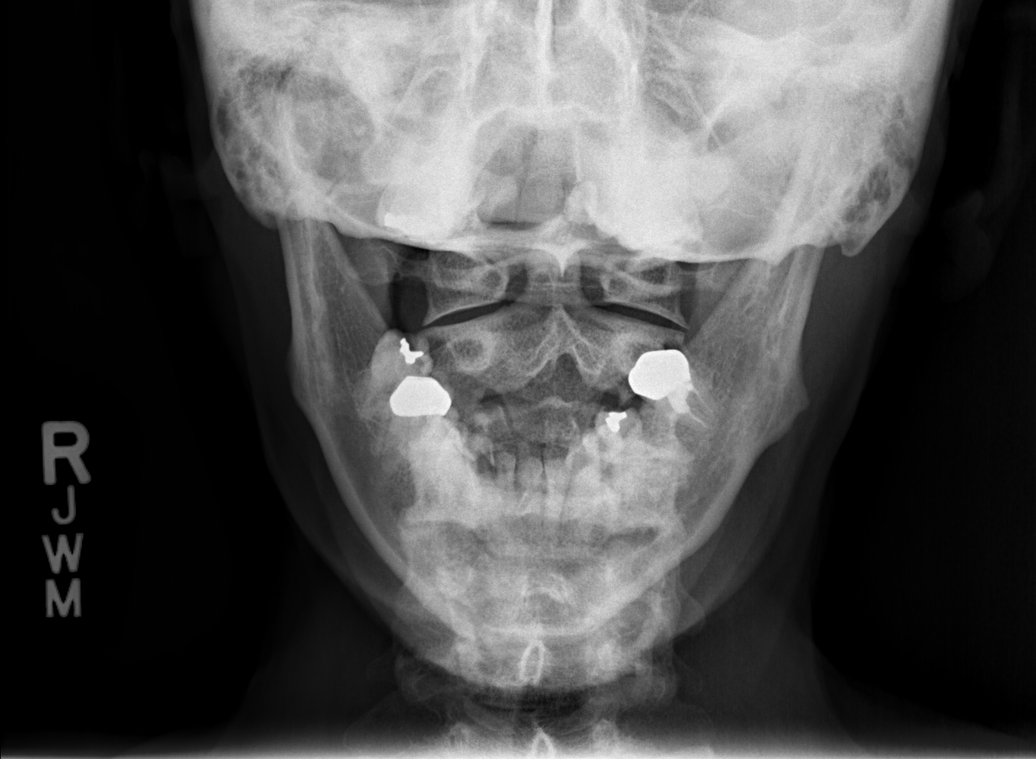

[w c-spine odontoid (2 of 2)]
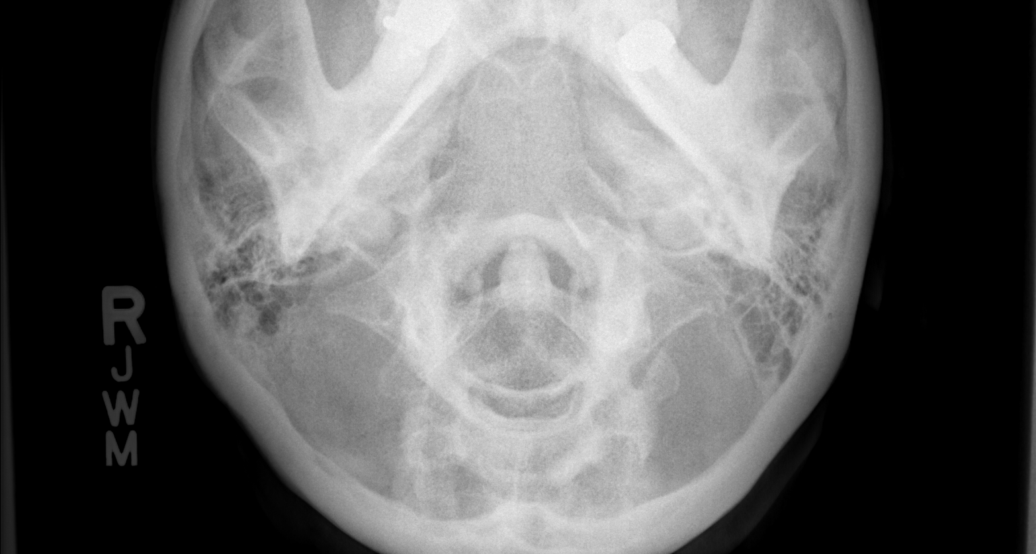

[6 of 6 positions shown; findings below may reference images not displayed]

FINDINGS: The cervical vertebrae are in normal alignment.  There is
degenerative disc disease particularly at the C5-6 level and to a
lesser degree at C6-7.  No prevertebral soft tissue swelling is
seen.  There is some foraminal narrowing bilaterally at C5-6.  The
odontoid process appears intact.  The lung apices are clear.
IMPRESSION: Normal alignment with degenerative disc disease particularly at C5-
6 where there is some foraminal narrowing present bilaterally.

## 2013-10-20 MED ORDER — FERUMOXYTOL INJECTION 510 MG/17 ML
1020.0000 mg | Freq: Once | INTRAVENOUS | Status: AC
  Administered 2013-10-20: 1020 mg via INTRAVENOUS
  Filled 2013-10-20: qty 34

## 2013-10-20 MED ORDER — SODIUM CHLORIDE 0.9 % IV SOLN
Freq: Once | INTRAVENOUS | Status: AC
  Administered 2013-10-20: 14:00:00 via INTRAVENOUS

## 2013-10-20 MED ORDER — SODIUM CHLORIDE 0.9 % IJ SOLN
3.0000 mL | Freq: Once | INTRAMUSCULAR | Status: DC | PRN
  Filled 2013-10-20: qty 10

## 2013-10-20 NOTE — Patient Instructions (Signed)

## 2013-10-20 NOTE — Telephone Encounter (Signed)
Will call in pending approval 

## 2013-10-20 NOTE — Telephone Encounter (Signed)
Xanax called in 

## 2013-11-09 ENCOUNTER — Encounter: Payer: Self-pay | Admitting: Oncology

## 2013-11-09 NOTE — Progress Notes (Signed)
Will continue to watch to see if insurance will cover Feraheme.

## 2013-12-15 ENCOUNTER — Encounter: Payer: Self-pay | Admitting: Oncology

## 2013-12-15 NOTE — Progress Notes (Signed)
[facility].

## 2013-12-27 ENCOUNTER — Other Ambulatory Visit: Payer: Self-pay | Admitting: Internal Medicine

## 2013-12-28 NOTE — Telephone Encounter (Signed)
Requested Medications     Medication name:  Name from pharmacy:  ALPRAZolam (XANAX) 0.5 MG tablet  ALPRAZOLAM 0.5 MG TABLET    Sig: TAKE 1 TABLET BY MOUTH EVERY DAY    Dispense: 30 tablet Refills: 1 Start: 12/27/2013  Class: Normal    Notes to pharmacy: Not to exceed 4 additional fills before 04/18/2014    Requested on: 10/20/2013    Originally ordered on: 04/22/2012 Last refill: 11/17/2013 Order History and Details

## 2014-01-13 ENCOUNTER — Other Ambulatory Visit (HOSPITAL_BASED_OUTPATIENT_CLINIC_OR_DEPARTMENT_OTHER): Payer: BC Managed Care – PPO

## 2014-01-13 ENCOUNTER — Ambulatory Visit (HOSPITAL_BASED_OUTPATIENT_CLINIC_OR_DEPARTMENT_OTHER): Payer: BC Managed Care – PPO | Admitting: Physician Assistant

## 2014-01-13 ENCOUNTER — Telehealth: Payer: Self-pay | Admitting: Oncology

## 2014-01-13 ENCOUNTER — Encounter: Payer: Self-pay | Admitting: Physician Assistant

## 2014-01-13 VITALS — BP 120/76 | HR 66 | Temp 98.0°F | Resp 19 | Ht 60.0 in | Wt 108.7 lb

## 2014-01-13 DIAGNOSIS — D509 Iron deficiency anemia, unspecified: Secondary | ICD-10-CM

## 2014-01-13 DIAGNOSIS — D500 Iron deficiency anemia secondary to blood loss (chronic): Secondary | ICD-10-CM

## 2014-01-13 DIAGNOSIS — N920 Excessive and frequent menstruation with regular cycle: Secondary | ICD-10-CM

## 2014-01-13 DIAGNOSIS — Z862 Personal history of diseases of the blood and blood-forming organs and certain disorders involving the immune mechanism: Secondary | ICD-10-CM

## 2014-01-13 LAB — COMPREHENSIVE METABOLIC PANEL (CC13)
ALK PHOS: 54 U/L (ref 40–150)
ALT: 12 U/L (ref 0–55)
AST: 20 U/L (ref 5–34)
Albumin: 3.9 g/dL (ref 3.5–5.0)
Anion Gap: 7 mEq/L (ref 3–11)
BUN: 13 mg/dL (ref 7.0–26.0)
CO2: 27 mEq/L (ref 22–29)
CREATININE: 0.7 mg/dL (ref 0.6–1.1)
Calcium: 9.2 mg/dL (ref 8.4–10.4)
Chloride: 100 mEq/L (ref 98–109)
Glucose: 84 mg/dl (ref 70–140)
POTASSIUM: 4.4 meq/L (ref 3.5–5.1)
Sodium: 134 mEq/L — ABNORMAL LOW (ref 136–145)
Total Bilirubin: 0.57 mg/dL (ref 0.20–1.20)
Total Protein: 7 g/dL (ref 6.4–8.3)

## 2014-01-13 LAB — CBC WITH DIFFERENTIAL/PLATELET
BASO%: 0.9 % (ref 0.0–2.0)
Basophils Absolute: 0 10*3/uL (ref 0.0–0.1)
EOS%: 4.4 % (ref 0.0–7.0)
Eosinophils Absolute: 0.2 10*3/uL (ref 0.0–0.5)
HCT: 38.8 % (ref 34.8–46.6)
HGB: 12.6 g/dL (ref 11.6–15.9)
LYMPH%: 37.6 % (ref 14.0–49.7)
MCH: 29.5 pg (ref 25.1–34.0)
MCHC: 32.5 g/dL (ref 31.5–36.0)
MCV: 90.7 fL (ref 79.5–101.0)
MONO#: 0.4 10*3/uL (ref 0.1–0.9)
MONO%: 9 % (ref 0.0–14.0)
NEUT#: 2 10*3/uL (ref 1.5–6.5)
NEUT%: 48.1 % (ref 38.4–76.8)
Platelets: 205 10*3/uL (ref 145–400)
RBC: 4.28 10*6/uL (ref 3.70–5.45)
RDW: 15.2 % — AB (ref 11.2–14.5)
WBC: 4.3 10*3/uL (ref 3.9–10.3)
lymph#: 1.6 10*3/uL (ref 0.9–3.3)

## 2014-01-13 LAB — IRON AND TIBC CHCC
%SAT: 31 % (ref 21–57)
IRON: 80 ug/dL (ref 41–142)
TIBC: 259 ug/dL (ref 236–444)
UIBC: 179 ug/dL (ref 120–384)

## 2014-01-13 LAB — FERRITIN CHCC: Ferritin: 233 ng/ml (ref 9–269)

## 2014-01-13 NOTE — Telephone Encounter (Signed)
gv adn rpinted appt sched and avs for pt for OCT. °

## 2014-01-13 NOTE — Progress Notes (Signed)
Hematology and Oncology Follow Up Visit  Joanne Robertson [id number] 05/12/1966 48 y.o. 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 She underwent enough B. as well as colonoscopy in April of 2015 with no clear-cut GI source of bleeding. Her iron deficiency is likely related to menstrual bleeding which has resulted in the level of ferritin level and mild anemia. She received fair he may infusion on 10/20/2013 and tolerated the infusion without difficulty. She reports she continues to have some fatigue but overall feels better. She reports occasional dizzy spells without frank presyncope or syncope. She has some occasional low back pain as well as right elbow soreness. She is currently not experiencing any overt bleeding or bruising. She denies any fever, chills, cough, hemoptysis. She's had no hematochezia or hematemesis. Remainder of the review of systems is unremarkable.  Medications: I personally reviewed her medication list.   Allergies:  Allergies  Allergen Reactions  . Penicillins     REACTION: rash, itching  . Promethazine Hcl     REACTION: agitated  . Sulfonamide Derivatives     REACTION: rash, itching    Past Medical History, Surgical history, Social history, and Family History were reviewed and updated.  Review of Systems: Constitutional:  Negative for fever, chills, night sweats, anorexia, weight loss, pain. Cardiovascular: no chest pain or dyspnea on exertion Respiratory: no cough, shortness of breath, or wheezing Neurological: no TIA or stroke symptoms Dermatological: negative ENT: negative Gastrointestinal: no abdominal pain, change in bowel habits, or black or bloody  stools Genito-Urinary: no dysuria, trouble voiding, or hematuria Hematological and Lymphatic: negative Breast: negative Musculoskeletal: positive for - joint pain and pain in back - lower Remaining ROS negative.  Physical Exam: Blood pressure 120/76, pulse 66, temperature 98 F (36.7 C), temperature source Oral, resp. rate 19, height 5' (1.524 m), weight 108 lb 11.2 oz (49.306 kg). ECOG: 0 General appearance: alert, cooperative, appears stated age and no distress Head: Normocephalic, without obvious abnormality, atraumatic Mouth:clear, no evidence of thrush or mucositis Neck: no adenopathy, no carotid bruit, no JVD, supple, symmetrical, trachea midline and thyroid not enlarged, symmetric, no tenderness/mass/nodules Lymph nodes: Cervical, supraclavicular, and axillary nodes normal. Resp: clear to auscultation bilaterally Cardio: regular rate and rhythm, S1, S2 normal, no murmur, click, rub or gallop GI: soft, non-tender; bowel sounds normal; no masses,  no organomegaly Extremities: extremities normal, atraumatic, no cyanosis or edema    Lab Results: Lab Results  Component Value Date   WBC 4.3 01/13/2014   HGB 12.6 01/13/2014   HCT 38.8 01/13/2014   MCV 90.7 01/13/2014   PLT 205 01/13/2014     Chemistry      Component Value Date/Time   NA 136 10/14/2013 1047   NA 137 05/10/2013 1021   K 3.9 10/14/2013 1047   K 4.2 05/10/2013 1021   CL 100 05/10/2013 1021   CO2 28 10/14/2013 1047   CO2 29 05/10/2013 1021   BUN 11.8 10/14/2013 1047   BUN 11 05/10/2013 1021   CREATININE 0.7 10/14/2013 1047   CREATININE 0.71 05/10/2013 1021   CREATININE 1.0 10/24/2007 1341      Component Value Date/Time   CALCIUM 9.6 10/14/2013 1047   CALCIUM 9.7 05/10/2013 1021   ALKPHOS 63 10/14/2013 1047  ALKPHOS 63 05/10/2013 1021   AST 18 10/14/2013 1047   AST 22 05/10/2013 1021   ALT 9 10/14/2013 1047   ALT 13 05/10/2013 1021   BILITOT 0.44 10/14/2013 1047   BILITOT 0.7 05/10/2013 1021        Radiological Studies: chest X-ray No results found.  Impression and Plan: Patient is a very pleasant 35 old Caucasian female with a history of iron deficiency anemia related to chronic blood loss related to her menstrual periods. Overall she is feeling well and tolerated her infusion of Feraheme in April 2015 without difficulty. Her studies are pending from today. Should her ferritin and iron studies reveal a need for a repeat ferritin infusion she will be notified. Otherwise we'll see her in 3 months for a another symptom management visit with repeat CBC differential C. met ferritin serum iron and TIBC.     Carlton Adam, PA-C 7/16/201512:48 PM

## 2014-01-17 NOTE — Patient Instructions (Signed)
You'll be notified if your iron studies reveal a need for a repeat feraheme infusion. Followup in 3 months.

## 2014-01-19 ENCOUNTER — Ambulatory Visit (INDEPENDENT_AMBULATORY_CARE_PROVIDER_SITE_OTHER): Payer: BC Managed Care – PPO | Admitting: Internal Medicine

## 2014-01-19 ENCOUNTER — Encounter: Payer: Self-pay | Admitting: Internal Medicine

## 2014-01-19 VITALS — BP 112/71 | HR 69 | Temp 97.1°F | Resp 17 | Ht 60.0 in | Wt 106.0 lb

## 2014-01-19 DIAGNOSIS — M25521 Pain in right elbow: Secondary | ICD-10-CM

## 2014-01-19 DIAGNOSIS — D508 Other iron deficiency anemias: Secondary | ICD-10-CM

## 2014-01-19 DIAGNOSIS — M25529 Pain in unspecified elbow: Secondary | ICD-10-CM

## 2014-01-19 DIAGNOSIS — R42 Dizziness and giddiness: Secondary | ICD-10-CM

## 2014-01-19 MED ORDER — NABUMETONE 500 MG PO TABS: ORAL_TABLET | ORAL | Status: DC

## 2014-01-19 MED ORDER — MECLIZINE HCL 25 MG PO TABS: ORAL_TABLET | ORAL | Status: DC

## 2014-01-19 NOTE — Progress Notes (Signed)
Subjective:    Patient ID: Joanne CivilAmanda M Robertson, female    DOB: 04/17/1966, 48 y.o.   MRN: [id number]  HPI  Spinning sensation last week   No LOC  No palpitaitons or chest pressure.  No visual or speech changes no numbness or muslce weakness.    Also has achy pain  R elbow when she lifts things.  She does work on the computer   FE deficiency anemia  .  Presumed from menstrual loss  She had FE transfusion about one month ago  HGB now 12.6  Allergies  Allergen Reactions  . Penicillins     REACTION: rash, itching  . Promethazine Hcl     REACTION: agitated  . Sulfonamide Derivatives     REACTION: rash, itching   Past Medical History  Diagnosis Date  . GERD (gastroesophageal reflux disease)   . IBS (irritable bowel syndrome)   . Clostridium difficile infection 2004  . Lymphocytic colitis   . Esophageal stricture 03/2009  . Giardia   . Anemia    Past Surgical History  Procedure Laterality Date  . Cesarean section     History   Social History  . Marital Status: Married    Spouse Name: N/A    Number of Children: N/A  . Years of Education: N/A   Occupational History  . Not on file.   Social History Main Topics  . Smoking status: Former Smoker    Types: Cigarettes  . Smokeless tobacco: Never Used  . Alcohol Use: Yes     Comment: 4 glasses per week  . Drug Use: No  . Sexual Activity: No   Other Topics Concern  . Not on file   Social History Narrative  . No narrative on file   Family History  Problem Relation Age of Onset  . Breast cancer Maternal Aunt     x 2  . Breast cancer Maternal Grandmother   . Diabetes Father    Patient Active Problem List   Diagnosis Date Noted  . Iron deficiency anemia secondary to blood loss (chronic) 10/14/2013  . ANA positive 04/30/2012  . History of abnormal mammogram 03/11/2012  . History of anemia 03/11/2012  . History of abnormal Pap smear 03/11/2012  . History of cervical polypectomy 03/11/2012  . Family history of breast  cancer 03/11/2012  . Giardia   . ESOPHAGEAL STRICTURE 04/11/2009  . NAUSEA ALONE 03/13/2009  . DYSPHAGIA 03/13/2009  . FLATULENCE-GAS-BLOATING 03/13/2009  . OTH&UNSPEC NONINFECTIOUS GASTROENTERITIS&COLITIS 09/02/2008  . FUNCTIONAL DIARRHEA 05/30/2008  . GERD 12/01/2007  . IBS 12/01/2007  . DIARRHEA 12/01/2007   Current Outpatient Prescriptions on File Prior to Visit  Medication Sig Dispense Refill  . ALPRAZolam (XANAX) 0.5 MG tablet TAKE 1 TABLET BY MOUTH EVERY DAY  30 tablet  1  . FLUoxetine (PROZAC) 20 MG capsule TAKE ONE CAPSULE BY MOUTH EVERY MORNING  90 capsule  1  . omeprazole (PRILOSEC) 20 MG capsule TAKE ONE CAPSULE BY MOUTH EVERY DAY 30 MINUTES BEFORE MEAL  90 capsule  0  . Probiotic Product (PROBIOTIC DAILY PO) Take 1 tablet by mouth daily.       No current facility-administered medications on file prior to visit.      Review of Systems See HPI    Objective:   Physical Exam Physical Exam  Nursing note and vitals reviewed.  Constitutional: She is oriented to person, place, and time. She appears well-developed and well-nourished.  HENT:  Head: Normocephalic and atraumatic.  Cardiovascular: Normal rate  and regular rhythm. Exam reveals no gallop and no friction rub.  No murmur heard.  Pulmonary/Chest: Breath sounds normal. She has no wheezes. She has no rales.  Neurological: She is alert and oriented to person, place, and time. CNII-Xii intact Reflexes 2+ symmetric   Motor 5/5 Ue and Le Grossly nonfocal Skin: Skin is warm and dry.  M/S  Pain R elbow with pronation Psychiatric: She has a normal mood and affect. Her behavior is normal.              Assessment & Plan:  Dizziness  ekg no acute changes.  Clinically consistant with vertigo.  She tells me she is going out of town in 48 hours.  Will give meclizine and evaluate in neurorehab upon her return  R elbow tendinitis   Ok for RElafen 500 bid  See me in 2 weeks

## 2014-02-10 ENCOUNTER — Ambulatory Visit (INDEPENDENT_AMBULATORY_CARE_PROVIDER_SITE_OTHER): Payer: BC Managed Care – PPO | Admitting: Internal Medicine

## 2014-02-10 ENCOUNTER — Encounter: Payer: Self-pay | Admitting: Internal Medicine

## 2014-02-10 VITALS — BP 116/85 | HR 65 | Temp 97.8°F | Resp 16 | Ht 59.0 in | Wt 110.0 lb

## 2014-02-10 DIAGNOSIS — M6580 Other synovitis and tenosynovitis, unspecified site: Secondary | ICD-10-CM

## 2014-02-10 DIAGNOSIS — M25559 Pain in unspecified hip: Secondary | ICD-10-CM

## 2014-02-10 DIAGNOSIS — M25552 Pain in left hip: Secondary | ICD-10-CM

## 2014-02-10 DIAGNOSIS — H8110 Benign paroxysmal vertigo, unspecified ear: Secondary | ICD-10-CM

## 2014-02-10 DIAGNOSIS — M778 Other enthesopathies, not elsewhere classified: Secondary | ICD-10-CM

## 2014-02-10 NOTE — Progress Notes (Signed)
Subjective:    Patient ID: Joanne CivilAmanda M Robertson, female    DOB: 08/29/1965, 48 y.o.   MRN: [id number]  HPI Joanne Robertson is here for follow up on vertigo like symptoms.  She was prescribed Antivert   She went to the beach and had a good time.  Dizziness "80% improved"    Some insomnia.   Situational stress with elderly mother living at home who is addicted to Lorazepam.   Pt does not wish RX drug now   Elbow tendinitis improved with taking 200 mg ibuprofen BID  She does have pain in left hip for past 3 months   No injury or trauma.  Some improvement with the Ibuprofen  Allergies  Allergen Reactions  . Penicillins     REACTION: rash, itching  . Promethazine Hcl     REACTION: agitated  . Sulfonamide Derivatives     REACTION: rash, itching   Past Medical History  Diagnosis Date  . GERD (gastroesophageal reflux disease)   . IBS (irritable bowel syndrome)   . Clostridium difficile infection 2004  . Lymphocytic colitis   . Esophageal stricture 03/2009  . Giardia   . Anemia    Past Surgical History  Procedure Laterality Date  . Cesarean section     History   Social History  . Marital Status: Married    Spouse Name: N/A    Number of Children: N/A  . Years of Education: N/A   Occupational History  . Not on file.   Social History Main Topics  . Smoking status: Former Smoker    Types: Cigarettes  . Smokeless tobacco: Never Used  . Alcohol Use: Yes     Comment: 4 glasses per week  . Drug Use: No  . Sexual Activity: No   Other Topics Concern  . Not on file   Social History Narrative  . No narrative on file   Family History  Problem Relation Age of Onset  . Breast cancer Maternal Aunt     x 2  . Breast cancer Maternal Grandmother   . Diabetes Father    Patient Active Problem List   Diagnosis Date Noted  . Iron deficiency anemia secondary to blood loss (chronic) 10/14/2013  . ANA positive 04/30/2012  . History of abnormal mammogram 03/11/2012  . History of anemia  03/11/2012  . History of abnormal Pap smear 03/11/2012  . History of cervical polypectomy 03/11/2012  . Family history of breast cancer 03/11/2012  . Giardia   . ESOPHAGEAL STRICTURE 04/11/2009  . NAUSEA ALONE 03/13/2009  . DYSPHAGIA 03/13/2009  . FLATULENCE-GAS-BLOATING 03/13/2009  . OTH&UNSPEC NONINFECTIOUS GASTROENTERITIS&COLITIS 09/02/2008  . FUNCTIONAL DIARRHEA 05/30/2008  . GERD 12/01/2007  . IBS 12/01/2007  . DIARRHEA 12/01/2007   Current Outpatient Prescriptions on File Prior to Visit  Medication Sig Dispense Refill  . ALPRAZolam (XANAX) 0.5 MG tablet TAKE 1 TABLET BY MOUTH EVERY DAY  30 tablet  1  . FLUoxetine (PROZAC) 20 MG capsule TAKE ONE CAPSULE BY MOUTH EVERY MORNING  90 capsule  1  . meclizine (ANTIVERT) 25 MG tablet Take one tablet bid prn dizziness  30 tablet  0  . nabumetone (RELAFEN) 500 MG tablet Take one tablet bid with food  30 tablet  0  . omeprazole (PRILOSEC) 20 MG capsule TAKE ONE CAPSULE BY MOUTH EVERY DAY 30 MINUTES BEFORE MEAL  90 capsule  0  . Probiotic Product (PROBIOTIC DAILY PO) Take 1 tablet by mouth daily.       No current  facility-administered medications on file prior to visit.       Review of Systems See HPI    Objective:   Physical Exam Physical Exam  Nursing note and vitals reviewed.  Constitutional: She is oriented to person, place, and time. She appears well-developed and well-nourished.  HENT:  Head: Normocephalic and atraumatic.  Cardiovascular: Normal rate and regular rhythm. Exam reveals no gallop and no friction rub.  No murmur heard.  Pulmonary/Chest: Breath sounds normal. She has no wheezes. She has no rales.  Neurological: She is alert and oriented to person, place, and time.  Skin: Skin is warm and dry.  M/S left hip full ROM with no complaint of discomfort  Psychiatric: She has a normal mood and affect. Her behavior is normal.       Assessment & Plan:  Vertigo:   Improved off meds now    Elbow tenditiis  Nsaid  prn   Probable hip bursitis.  Pt has relafen at home.  Advised to stop ibuprofen and take RElafen bid for next two weeks.  If not better she is to call and will refer to Sports Medicine   Schedule cpe

## 2014-02-10 NOTE — Patient Instructions (Signed)
Call me if hip not better

## 2014-02-12 ENCOUNTER — Other Ambulatory Visit: Payer: Self-pay | Admitting: Internal Medicine

## 2014-02-14 NOTE — Telephone Encounter (Signed)
Verbal called into to [facility] & pt informed.

## 2014-02-14 NOTE — Telephone Encounter (Signed)
Requested Medications     Medication name:  Name from pharmacy:  ALPRAZolam (XANAX) 0.5 MG tablet  ALPRAZOLAM 0.5 MG TABLET    Sig: TAKE 1 TABLET EVERY DAY AS NEEDED    Dispense: 30 tablet Refills: 1 Start: 02/12/2014  Class: Normal    Notes to pharmacy: Not to exceed 4 additional fills before 06/27/2014    Requested on: 12/29/2013    Originally ordered on: 04/22/2012 Last refill: 01/29/2014 Order History and Details

## 2014-03-10 ENCOUNTER — Encounter: Payer: Self-pay | Admitting: Gastroenterology

## 2014-03-10 ENCOUNTER — Other Ambulatory Visit (INDEPENDENT_AMBULATORY_CARE_PROVIDER_SITE_OTHER): Payer: BC Managed Care – PPO

## 2014-03-10 ENCOUNTER — Ambulatory Visit (INDEPENDENT_AMBULATORY_CARE_PROVIDER_SITE_OTHER): Payer: BC Managed Care – PPO | Admitting: Gastroenterology

## 2014-03-10 VITALS — BP 102/74 | HR 76 | Ht 65.0 in | Wt 110.0 lb

## 2014-03-10 DIAGNOSIS — M25551 Pain in right hip: Secondary | ICD-10-CM

## 2014-03-10 DIAGNOSIS — K219 Gastro-esophageal reflux disease without esophagitis: Secondary | ICD-10-CM

## 2014-03-10 DIAGNOSIS — R1032 Left lower quadrant pain: Secondary | ICD-10-CM

## 2014-03-10 DIAGNOSIS — M25559 Pain in unspecified hip: Secondary | ICD-10-CM

## 2014-03-10 DIAGNOSIS — M25552 Pain in left hip: Secondary | ICD-10-CM

## 2014-03-10 DIAGNOSIS — D509 Iron deficiency anemia, unspecified: Secondary | ICD-10-CM

## 2014-03-10 LAB — IGA: IgA: 185 mg/dL (ref 68–378)

## 2014-03-10 MED ORDER — PEG-KCL-NACL-NASULF-NA ASC-C 100 G PO SOLR: 1.0000 | Freq: Once | ORAL | Status: DC

## 2014-03-10 MED ORDER — OMEPRAZOLE 20 MG PO CPDR: DELAYED_RELEASE_CAPSULE | ORAL | Status: DC

## 2014-03-10 NOTE — Progress Notes (Signed)
    History of Present Illness: This is a 48 year old female with iron deficiency anemia diagnosed last year. She couldn't tolerate oral iron and underwent iron infusion. She previously underwent colonoscopy in 2010 to the terminal ileum, which was normal. She has had ongoing problems with left hip pain which radiates to her left back. This pain is improved with anti-inflammatories and does not change with any digestive function. Her reflux is well controlled on omeprazole except for recently she is having more belching symptoms. Denies weight loss, constipation, diarrhea, change in stool caliber, melena, hematochezia, nausea, vomiting, dysphagia, chest pain.  Current Medications, Allergies, Past Medical History, Past Surgical History, Family History and Social History were reviewed in [employer] record.  Physical Exam: General: Well developed , well nourished, no acute distress Head: Normocephalic and atraumatic Eyes:  sclerae anicteric, EOMI Ears: Normal auditory acuity Mouth: No deformity or lesions Lungs: Clear throughout to auscultation Heart: Regular rate and rhythm; no murmurs, rubs or bruits Abdomen: Soft, non tender and non distended. No masses, hepatosplenomegaly or hernias noted. Normal Bowel sounds Rectal: Deferred to colonoscopy  Musculoskeletal: Symmetrical with no gross deformities  Pulses:  Normal pulses noted Extremities: No clubbing, cyanosis, edema or deformities noted Neurological: Alert oriented x 4, grossly nonfocal Psychological:  Alert and cooperative. Normal mood and affect  Assessment and Recommendations:  1. Fe def anemia. Rule out occult gastrointestinal losses and celiac disease. Stool hemoccults, IgA, tTG. Schedule colonoscopy and egd. The risks, benefits, and alternatives to colonoscopy with possible biopsy and possible polypectomy were discussed with the patient and they consent to proceed. The risks, benefits, and alternatives to  endoscopy with possible biopsy and possible dilation were discussed with the patient and they consent to proceed.   2. GERD. Increased belching. Intensify antireflux measures. Increase omeprazole to 20 mg twice daily   3. Left hip pain. Per PCP.   4. IBS. Inactive.

## 2014-03-10 NOTE — Patient Instructions (Addendum)
Your physician has requested that you go to the basement for the following lab work before leaving today:TTG, IGA.  We have sent the following medications to your pharmacy for you to pick up at your convenience:omeprazole that we increase to 20 mg one tablet by mouth twice daily.   Follow the instructions on the Hemoccult cards and mail them back to [place] when you are finished or you may take them directly to the lab in the basement of the [facility] building. We will call you with the results.    You have been scheduled for an endoscopy and colonoscopy. Please follow the written instructions given to you at your visit today. Please pick up your prep at the pharmacy within the next 1-3 days. If you use inhalers (even only as needed), please bring them with you on the day of your procedure. Your physician has requested that you go to www.startemmi.com and enter the access code given to you at your visit today. This web site gives a general overview about your procedure. However, you should still follow specific instructions given to you by our office regarding your preparation for the procedure.  Thank you for choosing me and [facility].  Venita Lick. Pleas Koch., MD., Clementeen Graham

## 2014-03-11 LAB — TISSUE TRANSGLUTAMINASE, IGA: Tissue Transglutaminase Ab, IgA: 6.9 U/mL (ref <20)

## 2014-03-15 ENCOUNTER — Encounter: Payer: Self-pay | Admitting: Gastroenterology

## 2014-03-23 ENCOUNTER — Encounter: Payer: Self-pay | Admitting: Gastroenterology

## 2014-03-23 ENCOUNTER — Ambulatory Visit (AMBULATORY_SURGERY_CENTER): Payer: BC Managed Care – PPO | Admitting: Gastroenterology

## 2014-03-23 VITALS — BP 124/83 | HR 80 | Temp 98.4°F | Resp 18 | Ht 65.0 in | Wt 110.0 lb

## 2014-03-23 DIAGNOSIS — R1032 Left lower quadrant pain: Secondary | ICD-10-CM

## 2014-03-23 DIAGNOSIS — K2970 Gastritis, unspecified, without bleeding: Secondary | ICD-10-CM

## 2014-03-23 DIAGNOSIS — K317 Polyp of stomach and duodenum: Secondary | ICD-10-CM

## 2014-03-23 DIAGNOSIS — K2990 Gastroduodenitis, unspecified, without bleeding: Secondary | ICD-10-CM

## 2014-03-23 DIAGNOSIS — D509 Iron deficiency anemia, unspecified: Secondary | ICD-10-CM

## 2014-03-23 DIAGNOSIS — K219 Gastro-esophageal reflux disease without esophagitis: Secondary | ICD-10-CM

## 2014-03-23 MED ORDER — SODIUM CHLORIDE 0.9 % IV SOLN: 500.0000 mL | INTRAVENOUS | Status: DC

## 2014-03-23 NOTE — Op Note (Signed)
[facility] [address] [place], [address]   COLONOSCOPY PROCEDURE REPORT  PATIENT: Joanne Robertson, Joanne Robertson  MR#: [id number] BIRTHDATE: 01-03-1966 , 48  yrs. old GENDER: female ENDOSCOPIST: Meryl Dare, MD, [facility] PROCEDURE DATE:  03/23/2014 PROCEDURE:   Colonoscopy, diagnostic First Screening Colonoscopy - Avg.  risk and is 50 yrs.  old or older - No.  Prior Negative Screening - Now for repeat screening. N/A  History of Adenoma - Now for follow-up colonoscopy & has been > or = to 3 yrs.  N/A  Polyps Removed Today? No.  Recommend repeat exam, <10 yrs? Polyps Removed Today? No.  Recommend repeat exam, <10 yrs? No. ASA CLASS:   Class II INDICATIONS:iron deficiency anemia and abdominal pain in the lower left quadrant. MEDICATIONS: Monitored anesthesia care and Propofol 250 mg DESCRIPTION OF PROCEDURE:   After the risks benefits and alternatives of the procedure were thoroughly explained, informed consent was obtained.  The digital rectal exam revealed no abnormalities of the rectum.   The LB [id number]  endoscope was introduced through the anus and advanced to the cecum, which was identified by both the appendix and ileocecal valve. No adverse events experienced.   The quality of the prep was excellent, using MoviPrep  The instrument was then slowly withdrawn as the colon was fully examined.  COLON FINDINGS: A normal appearing cecum, ileocecal valve, and appendiceal orifice were identified.  The ascending, transverse, descending, sigmoid colon, and rectum appeared unremarkable. Retroflexed views revealed internal Grade I hemorrhoids. The time to cecum=2 minutes 43 seconds.  Withdrawal time=9 minutes 45 seconds.  The scope was withdrawn and the procedure completed.  COMPLICATIONS: There were no complications.  ENDOSCOPIC IMPRESSION: 1. Normal colonoscopy 2. Grade I internal hemorrhoids  RECOMMENDATIONS: 1. Continue to follow colorectal cancer screening  guidelines for "routine risk" patients with a repeat colonoscopy in 10 years. There is no need for FOBT (stool) testing for at least 5 years. 2. EGD today  eSigned:  Meryl Dare, MD, [facility] 03/23/2014 3:00 PM

## 2014-03-23 NOTE — Op Note (Signed)
[facility] [address] [place], [address]   ENDOSCOPY PROCEDURE REPORT  PATIENT: Joanne, Robertson  MR#: [id number] BIRTHDATE: Jun 11, 1966 , 48  yrs. old GENDER: female ENDOSCOPIST: Meryl Dare, MD, [facility] PROCEDURE DATE:  03/23/2014 PROCEDURE:  EGD w/ biopsy ASA CLASS:     Class II INDICATIONS:  iron deficiency anemia, history of esophageal reflux, and abdominal pain in the lower left quadrant. MEDICATIONS: Monitored anesthesia care and Propofol 150 mg and residual sedation TOPICAL ANESTHETIC: none DESCRIPTION OF PROCEDURE: After the risks benefits and alternatives of the procedure were thoroughly explained, informed consent was obtained.  The    endoscope was introduced through the mouth and advanced to the second portion of the duodenum , limited by Without limitations. The instrument was slowly withdrawn as the mucosa was fully examined.  STOMACH: Multiple sessile polyps ranging between 3-48mm in size were found in the gastric fundus and gastric body.  Multiple biopsies was performed. Mild gastritis was found in the gastric body and gastric antrum.  Multiple biopsies were performed. The stomach otherwise appeared normal. ESOPHAGUS: The mucosa of the esophagus appeared normal. DUODENUM: The duodenal mucosa showed no abnormalities in the bulb and 2nd part of the duodenum.  Retroflexed views revealed no abnormalities.  The scope was then withdrawn from the patient and the procedure completed.  COMPLICATIONS: There were no complications.  ENDOSCOPIC IMPRESSION: 1.   Multiple sessile polyps in the gastric fundus and gastric body; multiple biopsies was performed 2.   Gastritis in the gastric body and gastric antrum; multiple biopsies were performed  RECOMMENDATIONS: 1.  Anti-reflux regimen 2.  Continue PPI 3.  Await pathology results 4.  Office appt in 1 month  eSigned:  Meryl Dare, MD, [facility] 03/23/2014 3:11 PM

## 2014-03-23 NOTE — Progress Notes (Signed)
Called to room to assist during endoscopic procedure.  Patient ID and intended procedure confirmed with present staff. Received instructions for my participation in the procedure from the performing physician.  

## 2014-03-23 NOTE — Patient Instructions (Signed)

## 2014-03-23 NOTE — Progress Notes (Signed)
Report to PACU, RN, vss, BBS= Clear.  

## 2014-03-24 ENCOUNTER — Telehealth: Payer: Self-pay | Admitting: *Deleted

## 2014-03-24 NOTE — Telephone Encounter (Signed)
  Follow up Call-  Call back number 03/23/2014  Post procedure Call Back phone  # [phone]  Permission to leave phone message Yes     Patient questions:  Do you have a fever, pain , or abdominal swelling? No. Pain Score  0 *  Have you tolerated food without any problems? Yes.    Have you been able to return to your normal activities? Yes.    Do you have any questions about your discharge instructions: Diet   No. Medications  No. Follow up visit  No.  Do you have questions or concerns about your Care? No.  Actions: * If pain score is 4 or above: No action needed, pain <4.

## 2014-03-28 ENCOUNTER — Encounter: Payer: Self-pay | Admitting: Gastroenterology

## 2014-04-14 ENCOUNTER — Other Ambulatory Visit (HOSPITAL_BASED_OUTPATIENT_CLINIC_OR_DEPARTMENT_OTHER): Payer: BC Managed Care – PPO

## 2014-04-14 ENCOUNTER — Telehealth: Payer: Self-pay | Admitting: Oncology

## 2014-04-14 ENCOUNTER — Ambulatory Visit (HOSPITAL_BASED_OUTPATIENT_CLINIC_OR_DEPARTMENT_OTHER): Payer: BC Managed Care – PPO | Admitting: Oncology

## 2014-04-14 VITALS — BP 105/71 | HR 82 | Temp 97.9°F | Resp 18 | Ht 65.0 in | Wt 110.6 lb

## 2014-04-14 DIAGNOSIS — N920 Excessive and frequent menstruation with regular cycle: Secondary | ICD-10-CM

## 2014-04-14 DIAGNOSIS — D500 Iron deficiency anemia secondary to blood loss (chronic): Secondary | ICD-10-CM

## 2014-04-14 DIAGNOSIS — Z862 Personal history of diseases of the blood and blood-forming organs and certain disorders involving the immune mechanism: Secondary | ICD-10-CM

## 2014-04-14 LAB — CBC WITH DIFFERENTIAL/PLATELET
BASO%: 0.9 % (ref 0.0–2.0)
Basophils Absolute: 0.1 10*3/uL (ref 0.0–0.1)
EOS%: 3.7 % (ref 0.0–7.0)
Eosinophils Absolute: 0.2 10*3/uL (ref 0.0–0.5)
HCT: 37.2 % (ref 34.8–46.6)
HGB: 12.1 g/dL (ref 11.6–15.9)
LYMPH#: 2.1 10*3/uL (ref 0.9–3.3)
LYMPH%: 32.3 % (ref 14.0–49.7)
MCH: 30.1 pg (ref 25.1–34.0)
MCHC: 32.6 g/dL (ref 31.5–36.0)
MCV: 92.3 fL (ref 79.5–101.0)
MONO#: 0.4 10*3/uL (ref 0.1–0.9)
MONO%: 5.5 % (ref 0.0–14.0)
NEUT%: 57.6 % (ref 38.4–76.8)
NEUTROS ABS: 3.8 10*3/uL (ref 1.5–6.5)
Platelets: 221 10*3/uL (ref 145–400)
RBC: 4.03 10*6/uL (ref 3.70–5.45)
RDW: 11.9 % (ref 11.2–14.5)
WBC: 6.5 10*3/uL (ref 3.9–10.3)

## 2014-04-14 LAB — COMPREHENSIVE METABOLIC PANEL (CC13)
ALT: 12 U/L (ref 0–55)
AST: 16 U/L (ref 5–34)
Albumin: 3.8 g/dL (ref 3.5–5.0)
Alkaline Phosphatase: 62 U/L (ref 40–150)
Anion Gap: 7 mEq/L (ref 3–11)
BUN: 18.6 mg/dL (ref 7.0–26.0)
CALCIUM: 9.4 mg/dL (ref 8.4–10.4)
CHLORIDE: 103 meq/L (ref 98–109)
CO2: 27 meq/L (ref 22–29)
Creatinine: 0.8 mg/dL (ref 0.6–1.1)
Glucose: 132 mg/dl (ref 70–140)
POTASSIUM: 3.8 meq/L (ref 3.5–5.1)
Sodium: 137 mEq/L (ref 136–145)
Total Bilirubin: 0.49 mg/dL (ref 0.20–1.20)
Total Protein: 7 g/dL (ref 6.4–8.3)

## 2014-04-14 LAB — IRON AND TIBC CHCC
%SAT: 28 % (ref 21–57)
IRON: 73 ug/dL (ref 41–142)
TIBC: 266 ug/dL (ref 236–444)
UIBC: 193 ug/dL (ref 120–384)

## 2014-04-14 LAB — FERRITIN CHCC: Ferritin: 156 ng/ml (ref 9–269)

## 2014-04-14 NOTE — Telephone Encounter (Signed)
Pt confirmed labs/ov per 10/15 POF, gave pt AVS..... KJ °

## 2014-04-14 NOTE — Progress Notes (Signed)
Hematology and Oncology Follow Up Visit  Joanne Robertson [id number] 01/18/1966 48 y.o. 04/14/2014 1:46 PM  Principle Diagnosis: 48 year old woman with Iron deficiency anemia diagnosed in April of 2015. She presented with a hemoglobin of 10.8, MCV of 78 and ferritin of 8. Iron deficiency likely related to menstrual losses.  Prior Therapy: Feraheme infusion last given 10/20/2013  Current therapy: Observation  Interim History:  Joanne Robertson presents today for a followup visit. Since the last visit, she reports new complaints. She have mild fatigue is still very active. She underwent endoscopy as well as colonoscopy in April of 2015 with no clear-cut GI source of bleeding. She received feraheme infusion on 10/20/2013 and tolerated the infusion without difficulty. She reports occasional dizzy spells without frank presyncope or syncope. She has some occasional low back pain as well as right elbow soreness. She is currently not experiencing any overt bleeding or bruising. She denies any fever, chills, cough, hemoptysis. She's had no hematochezia or hematemesis. She does not report any skeletal complaints. She does not report any epistaxis or lymphadenopathy. Remainder of the review of systems is unremarkable.  Medications: I personally reviewed her medication list.   Allergies:  Allergies  Allergen Reactions  . Penicillins     REACTION: rash, itching  . Promethazine Hcl     REACTION: agitated  . Sulfonamide Derivatives     REACTION: rash, itching    Past Medical History, Surgical history, Social history, and Family History were reviewed and updated.    Physical Exam: Blood pressure 105/71, pulse 82, temperature 97.9 F (36.6 C), temperature source Oral, resp. rate 18, height 5\' 5"  (1.651 m), weight 110 lb 9.6 oz (50.168 kg). ECOG: 0 General appearance: alert Head: Normocephalic, without obvious abnormality Mouth:clear, no evidence of thrush or mucositis Neck: no adenopathy Lymph nodes:  Cervical, supraclavicular, and axillary nodes normal. Resp: clear to auscultation bilaterally Cardio: regular rate and rhythm, S1, S2 normal, no murmur, click, rub or gallop GI: soft, non-tender; bowel sounds normal; no masses,  no organomegaly Extremities: extremities normal, atraumatic, no cyanosis or edema    Lab Results: Lab Results  Component Value Date   WBC 6.5 04/14/2014   HGB 12.1 04/14/2014   HCT 37.2 04/14/2014   MCV 92.3 04/14/2014   PLT 221 04/14/2014     Chemistry      Component Value Date/Time   NA 134* 01/13/2014 1159   NA 137 05/10/2013 1021   K 4.4 01/13/2014 1159   K 4.2 05/10/2013 1021   CL 100 05/10/2013 1021   CO2 27 01/13/2014 1159   CO2 29 05/10/2013 1021   BUN 13.0 01/13/2014 1159   BUN 11 05/10/2013 1021   CREATININE 0.7 01/13/2014 1159   CREATININE 0.71 05/10/2013 1021   CREATININE 1.0 10/24/2007 1341      Component Value Date/Time   CALCIUM 9.2 01/13/2014 1159   CALCIUM 9.7 05/10/2013 1021   ALKPHOS 54 01/13/2014 1159   ALKPHOS 63 05/10/2013 1021   AST 20 01/13/2014 1159   AST 22 05/10/2013 1021   ALT 12 01/13/2014 1159   ALT 13 05/10/2013 1021   BILITOT 0.57 01/13/2014 1159   BILITOT 0.7 05/10/2013 1021        Impression and Plan: [age] old  female with: 1. Iron deficiency anemia related to chronic blood loss related to her menstrual periods. Overall she is feeling well and tolerated her infusion of Feraheme in April 2015 without difficulty. Her iron studies repeated in July 2015 which were perfectly normal.  Her hemoglobin is adequate at this time. I see no reason for repeat IV iron at this time but will repeat her count in 4 months.  2. Gastritis: She follows up with gastroenterology which could also contribute to her iron deficiency.     Eli HoseSHADAD,FIRAS, MD 10/15/20151:46 PM

## 2014-04-18 ENCOUNTER — Ambulatory Visit: Payer: BC Managed Care – PPO | Admitting: Internal Medicine

## 2014-04-27 ENCOUNTER — Encounter: Payer: Self-pay | Admitting: Gastroenterology

## 2014-04-27 ENCOUNTER — Ambulatory Visit (INDEPENDENT_AMBULATORY_CARE_PROVIDER_SITE_OTHER): Payer: BC Managed Care – PPO | Admitting: Gastroenterology

## 2014-04-27 VITALS — BP 106/62 | HR 84 | Ht 65.0 in | Wt 111.4 lb

## 2014-04-27 DIAGNOSIS — D509 Iron deficiency anemia, unspecified: Secondary | ICD-10-CM

## 2014-04-27 DIAGNOSIS — K219 Gastro-esophageal reflux disease without esophagitis: Secondary | ICD-10-CM

## 2014-04-27 NOTE — Progress Notes (Signed)
    History of Present Illness: This is a 48 year old female return61ing for follow-up of iron deficiency anemia and GERD. Last month she underwent upper endoscopy and colonoscopy showing small benign gastric fundic polyps, chronic inactive gastritis and internal hemorrhoids. She recently had a follow-up with her hematologist. Hemoglobin was 12.1 and iron stores have normalized. Her reflux symptoms are under very good control. She relates she has had an intermittent sore throat for several months. The symptoms have not responded to PPI therapy.  Current Medications, Allergies, Past Medical History, Past Surgical History, Family History and Social History were reviewed in [facility] electronic medical record.  Physical Exam: General: Well developed , well nourished, no acute distress Head: Normocephalic and atraumatic Eyes:  sclerae anicteric, EOMI Ears: Normal auditory acuity Mouth: No deformity or lesions Lungs: Clear throughout to auscultation Heart: Regular rate and rhythm; no murmurs, rubs or bruits Abdomen: Soft, non tender and non distended. No masses, hepatosplenomegaly or hernias noted. Normal Bowel sounds Musculoskeletal: Symmetrical with no gross deformities  Pulses:  Normal pulses noted Extremities: No clubbing, cyanosis, edema or deformities noted Neurological: Alert oriented x 4, grossly nonfocal Psychological:  Alert and cooperative. Normal mood and affect  Assessment and Recommendations:  1. Fe def anemia due to menstrual losses. No gastrointestinal disorders uncovered. Further follow-up with her PCP and Dr. Clelia CroftShadad.  2. GERD. Continue antireflux measures. Continue omeprazole 20 mg twice daily. REV in 1 year.    3. IBS. Minimal symptoms on daily probiotic.  4. Sore throat. Does not appear to be related to GERD or LPR. Follow-up with her PCP.

## 2014-04-27 NOTE — Patient Instructions (Addendum)
Please follow up with Dr. Russella DarStark in 1 year.    I appreciate the opportunity to care for you.

## 2014-05-02 ENCOUNTER — Encounter: Payer: Self-pay | Admitting: Gastroenterology

## 2014-06-14 ENCOUNTER — Other Ambulatory Visit: Payer: Self-pay | Admitting: Internal Medicine

## 2014-06-16 ENCOUNTER — Other Ambulatory Visit: Payer: Self-pay | Admitting: *Deleted

## 2014-06-16 NOTE — Telephone Encounter (Signed)
Refill request

## 2014-06-17 MED ORDER — FLUOXETINE HCL 20 MG PO CAPS: 20.0000 mg | ORAL_CAPSULE | Freq: Every morning | ORAL | Status: DC

## 2014-06-29 ENCOUNTER — Other Ambulatory Visit: Payer: Self-pay | Admitting: Internal Medicine

## 2014-07-04 NOTE — Telephone Encounter (Signed)
Refill request

## 2014-07-14 ENCOUNTER — Other Ambulatory Visit: Payer: Self-pay | Admitting: *Deleted

## 2014-07-14 DIAGNOSIS — Z0000 Encounter for general adult medical examination without abnormal findings: Secondary | ICD-10-CM

## 2014-07-17 NOTE — Progress Notes (Signed)
Subjective:    Patient ID: Joanne Robertson, female    DOB: 29-Jul-1965, 49 y.o.   MRN: [id number]  HPI 03/2014    GI note 1. Fe def anemia due to menstrual losses. No gastrointestinal disorders uncovered. Further follow-up with her PCP and Dr. Alen Blew.  2. GERD. Continue antireflux measures. Continue omeprazole 20 mg twice daily. REV in 1 year.   3. IBS. Minimal symptoms on daily probiotic.  4. Sore throat. Does not appear to be related to GERD or LPR. Follow-up with her PCP.   03/2014 Hematology note Impression and Plan: 65 old female with: 1. Iron deficiency anemia related to chronic blood loss related to her menstrual periods. Overall she is feeling well and tolerated her infusion of Feraheme in April 2015 without difficulty. Her iron studies repeated in July 2015 which were perfectly normal. Her hemoglobin is adequate at this time. I see no reason for repeat IV iron at this time but will repeat her count in 4 months.  2. Gastritis: She follows up with gastroenterology which could also contribute to her iron deficiency.    TODAY  Joanne Robertson is here for CPE HM:  Pap per Dr. Ronita Hipps  Mm neg 01/2014,  Needs TDAP today  She is 10 pack-year smoker,  Quit over 15 years ago  colonscopy done 03/2014  Dizziness still has dizzy feeling off and on.  Never went to neurorehab.  Last episode yesterday   No hearing changes does not feel like fainting.  Feels "off balance"  Happens on a weakly basis.  She attributes this to family stresses  Anemia  See above last labs look great.    FH of breast CA (several second degree relatives)  She could not get BRCA as insurance would not pay for test.   Most recent mm neg    Allergies  Allergen Reactions  . Penicillins     REACTION: rash, itching  . Promethazine Hcl     REACTION: agitated  . Sulfonamide Derivatives     REACTION: rash, itching   Past Medical History  Diagnosis Date  . GERD (gastroesophageal reflux disease)   . IBS (irritable bowel  syndrome)   . Clostridium difficile infection 2004  . Lymphocytic colitis   . Esophageal stricture 03/2009  . Giardia   . Anemia   . Allergy     SEASONAL  . Arthritis    Past Surgical History  Procedure Laterality Date  . Cesarean section     History   Social History  . Marital Status: Married    Spouse Name: N/A    Number of Children: N/A  . Years of Education: N/A   Occupational History  . Not on file.   Social History Main Topics  . Smoking status: Former Smoker    Types: Cigarettes  . Smokeless tobacco: Never Used  . Alcohol Use: Yes     Comment: 4 glasses per week  . Drug Use: No  . Sexual Activity: No   Other Topics Concern  . Not on file   Social History Narrative   Family History  Problem Relation Age of Onset  . Breast cancer Maternal Aunt     x 2  . Breast cancer Maternal Grandmother   . Diabetes Father    Patient Active Problem List   Diagnosis Date Noted  . Iron deficiency anemia secondary to blood loss (chronic) 10/14/2013  . ANA positive 04/30/2012  . History of abnormal mammogram 03/11/2012  . History of anemia 03/11/2012  .  History of abnormal Pap smear 03/11/2012  . History of cervical polypectomy 03/11/2012  . Family history of breast cancer 03/11/2012  . Giardia   . ESOPHAGEAL STRICTURE 04/11/2009  . NAUSEA ALONE 03/13/2009  . DYSPHAGIA 03/13/2009  . FLATULENCE-GAS-BLOATING 03/13/2009  . OTH&UNSPEC NONINFECTIOUS GASTROENTERITIS&COLITIS 09/02/2008  . FUNCTIONAL DIARRHEA 05/30/2008  . GERD 12/01/2007  . IBS 12/01/2007  . DIARRHEA 12/01/2007   Current Outpatient Prescriptions on File Prior to Visit  Medication Sig Dispense Refill  . FLUoxetine (PROZAC) 20 MG capsule TAKE ONE CAPSULE BY MOUTH EVERY MORNING 90 capsule 1  . FLUoxetine (PROZAC) 20 MG capsule Take 1 capsule (20 mg total) by mouth every morning. 90 capsule 1  . FLUoxetine (PROZAC) 20 MG capsule TAKE ONE CAPSULE BY MOUTH EVERY MORNING 90 capsule 1  . omeprazole (PRILOSEC)  20 MG capsule TAKE ONE CAPSULE BY MOUTH TWICE DAILY 30 MINUTES BEFORE BREAKFAST AND DINNER 180 capsule 1  . Probiotic Product (PROBIOTIC DAILY PO) Take 1 tablet by mouth daily.     No current facility-administered medications on file prior to visit.       Review of Systems  Respiratory: Negative for cough, chest tightness, shortness of breath and wheezing.   Cardiovascular: Negative for chest pain, palpitations and leg swelling.  All other systems reviewed and are negative.      Objective:   Physical Exam Physical Exam  Nursing note and vitals reviewed.  Constitutional: She is oriented to person, place, and time. She appears well-developed and well-nourished.  HENT:  Head: Normocephalic and atraumatic.  Right Ear: Tympanic membrane and ear canal normal. No drainage. Tympanic membrane is not injected and not erythematous.  Left Ear: Tympanic membrane and ear canal normal. No drainage. Tympanic membrane is not injected and not erythematous.  Nose: Nose normal. Right sinus exhibits no maxillary sinus tenderness and no frontal sinus tenderness. Left sinus exhibits no maxillary sinus tenderness and no frontal sinus tenderness.  Mouth/Throat: Oropharynx is clear and moist. No oral lesions. No oropharyngeal exudate.  Eyes: Conjunctivae and EOM are normal. Pupils are equal, round, and reactive to light.  Neck: Normal range of motion. Neck supple. No JVD present. Carotid bruit is not present. No mass and no thyromegaly present.  Cardiovascular: Normal rate, regular rhythm, S1 normal, S2 normal and intact distal pulses. Exam reveals no gallop and no friction rub.  No murmur heard.  Pulses:  Carotid pulses are 2+ on the right side, and 2+ on the left side.  Dorsalis pedis pulses are 2+ on the right side, and 2+ on the left side.  No carotid bruit. No LE edema  Pulmonary/Chest: Breath sounds normal. She has no wheezes. She has no rales. She exhibits no tenderness.  Breast no discrete mass no  nipple discharge no axillary adenopath bilaterally Abdominal: Soft. Bowel sounds are normal. She exhibits no distension and no mass. There is no hepatosplenomegaly. There is no tenderness. There is no CVA tenderness.  Musculoskeletal: Normal range of motion.  No active synovitis to joints.  Lymphadenopathy:  She has no cervical adenopathy.  She has no axillary adenopathy.  Right: No inguinal and no supraclavicular adenopathy present.  Left: No inguinal and no supraclavicular adenopathy present.  Neurological: She is alert and oriented to person, place, and time. She has normal strength and normal reflexes. She displays no tremor. No cranial nerve deficit or sensory deficit. Coordination and gait normal.  Skin: Skin is warm and dry. No rash noted. No cyanosis. Nails show no clubbing.  Psychiatric: She  has a normal mood and affect. Her speech is normal and behavior is normal. Cognition and memory are normal.         Assessment & Plan:  HM  Will give TDAp today   Anemia:   Managed by hematology   Dizziness  Will refer to neuro rehab.  See me in 8 weeks if no improvement will need imaging   Fh breast CA  Insurance would not cover BRCA  Pos ANA she has rheumatologist in [facility] today  See m in 8 weeks

## 2014-07-18 ENCOUNTER — Encounter: Payer: Self-pay | Admitting: Internal Medicine

## 2014-07-18 ENCOUNTER — Ambulatory Visit (INDEPENDENT_AMBULATORY_CARE_PROVIDER_SITE_OTHER): Payer: BLUE CROSS/BLUE SHIELD | Admitting: Internal Medicine

## 2014-07-18 VITALS — BP 98/67 | HR 82 | Resp 16 | Ht 60.0 in | Wt 111.0 lb

## 2014-07-18 DIAGNOSIS — K219 Gastro-esophageal reflux disease without esophagitis: Secondary | ICD-10-CM

## 2014-07-18 DIAGNOSIS — R42 Dizziness and giddiness: Secondary | ICD-10-CM | POA: Diagnosis not present

## 2014-07-18 DIAGNOSIS — Z1211 Encounter for screening for malignant neoplasm of colon: Secondary | ICD-10-CM

## 2014-07-18 DIAGNOSIS — Z0000 Encounter for general adult medical examination without abnormal findings: Secondary | ICD-10-CM | POA: Diagnosis not present

## 2014-07-18 DIAGNOSIS — R7689 Other specified abnormal immunological findings in serum: Secondary | ICD-10-CM

## 2014-07-18 DIAGNOSIS — H8110 Benign paroxysmal vertigo, unspecified ear: Secondary | ICD-10-CM | POA: Diagnosis not present

## 2014-07-18 DIAGNOSIS — Z23 Encounter for immunization: Secondary | ICD-10-CM

## 2014-07-18 DIAGNOSIS — R768 Other specified abnormal immunological findings in serum: Secondary | ICD-10-CM

## 2014-07-18 LAB — POCT URINALYSIS DIPSTICK
Bilirubin, UA: NEGATIVE
Blood, UA: NEGATIVE
GLUCOSE UA: NEGATIVE
Ketones, UA: NEGATIVE
Leukocytes, UA: NEGATIVE
NITRITE UA: NEGATIVE
PH UA: 6.5
PROTEIN UA: NEGATIVE
Spec Grav, UA: 1.02
Urobilinogen, UA: NEGATIVE

## 2014-07-18 LAB — LIPID PANEL
CHOL/HDL RATIO: 2.6 ratio
Cholesterol: 183 mg/dL (ref 0–200)
HDL: 71 mg/dL (ref >39)
LDL Cholesterol: 99 mg/dL (ref 0–99)
Triglycerides: 67 mg/dL (ref <150)
VLDL: 13 mg/dL (ref 0–40)

## 2014-07-18 LAB — HEMOCCULT GUIAC POC 1CARD (OFFICE): FECAL OCCULT BLD: NEGATIVE

## 2014-07-18 LAB — TSH: TSH: 0.857 u[IU]/mL (ref 0.350–4.500)

## 2014-07-18 NOTE — Patient Instructions (Signed)
See me in 6-8 weeks 

## 2014-07-19 LAB — VITAMIN D 25 HYDROXY (VIT D DEFICIENCY, FRACTURES): Vit D, 25-Hydroxy: 31 ng/mL (ref 30–100)

## 2014-07-21 ENCOUNTER — Encounter: Payer: Self-pay | Admitting: Internal Medicine

## 2014-07-26 ENCOUNTER — Ambulatory Visit: Payer: BLUE CROSS/BLUE SHIELD | Admitting: Rehabilitation

## 2014-08-16 ENCOUNTER — Ambulatory Visit (HOSPITAL_BASED_OUTPATIENT_CLINIC_OR_DEPARTMENT_OTHER): Payer: BLUE CROSS/BLUE SHIELD | Admitting: Oncology

## 2014-08-16 ENCOUNTER — Other Ambulatory Visit (HOSPITAL_BASED_OUTPATIENT_CLINIC_OR_DEPARTMENT_OTHER): Payer: BLUE CROSS/BLUE SHIELD

## 2014-08-16 VITALS — BP 120/71 | HR 70 | Temp 98.1°F | Resp 18 | Ht 60.0 in | Wt 122.4 lb

## 2014-08-16 DIAGNOSIS — D500 Iron deficiency anemia secondary to blood loss (chronic): Secondary | ICD-10-CM

## 2014-08-16 DIAGNOSIS — Z862 Personal history of diseases of the blood and blood-forming organs and certain disorders involving the immune mechanism: Secondary | ICD-10-CM

## 2014-08-16 DIAGNOSIS — N920 Excessive and frequent menstruation with regular cycle: Secondary | ICD-10-CM

## 2014-08-16 LAB — COMPREHENSIVE METABOLIC PANEL (CC13)
ALBUMIN: 4.1 g/dL (ref 3.5–5.0)
ALK PHOS: 55 U/L (ref 40–150)
ALT: 16 U/L (ref 0–55)
ANION GAP: 9 meq/L (ref 3–11)
AST: 19 U/L (ref 5–34)
BUN: 12.7 mg/dL (ref 7.0–26.0)
CALCIUM: 9.4 mg/dL (ref 8.4–10.4)
CHLORIDE: 101 meq/L (ref 98–109)
CO2: 27 mEq/L (ref 22–29)
Creatinine: 0.7 mg/dL (ref 0.6–1.1)
Glucose: 96 mg/dl (ref 70–140)
POTASSIUM: 4.2 meq/L (ref 3.5–5.1)
SODIUM: 136 meq/L (ref 136–145)
TOTAL PROTEIN: 7 g/dL (ref 6.4–8.3)
Total Bilirubin: 0.74 mg/dL (ref 0.20–1.20)

## 2014-08-16 LAB — CBC WITH DIFFERENTIAL/PLATELET
BASO%: 1 % (ref 0.0–2.0)
Basophils Absolute: 0 10*3/uL (ref 0.0–0.1)
EOS%: 6.8 % (ref 0.0–7.0)
Eosinophils Absolute: 0.3 10*3/uL (ref 0.0–0.5)
HCT: 39.9 % (ref 34.8–46.6)
HGB: 12.6 g/dL (ref 11.6–15.9)
LYMPH#: 2 10*3/uL (ref 0.9–3.3)
LYMPH%: 45.9 % (ref 14.0–49.7)
MCH: 28.9 pg (ref 25.1–34.0)
MCHC: 31.6 g/dL (ref 31.5–36.0)
MCV: 91.4 fL (ref 79.5–101.0)
MONO#: 0.4 10*3/uL (ref 0.1–0.9)
MONO%: 8.5 % (ref 0.0–14.0)
NEUT#: 1.7 10*3/uL (ref 1.5–6.5)
NEUT%: 37.8 % — ABNORMAL LOW (ref 38.4–76.8)
Platelets: 211 10*3/uL (ref 145–400)
RBC: 4.36 10*6/uL (ref 3.70–5.45)
RDW: 12.3 % (ref 11.2–14.5)
WBC: 4.4 10*3/uL (ref 3.9–10.3)

## 2014-08-16 LAB — IRON AND TIBC CHCC
%SAT: 39 % (ref 21–57)
Iron: 112 ug/dL (ref 41–142)
TIBC: 285 ug/dL (ref 236–444)
UIBC: 173 ug/dL (ref 120–384)

## 2014-08-16 LAB — FERRITIN CHCC: FERRITIN: 107 ng/mL (ref 9–269)

## 2014-08-16 NOTE — Progress Notes (Signed)
Hematology and Oncology Follow Up Visit  Joanne Robertson [id number] Feb 05, 1966 49 y.o. 08/16/2014 9:10 AM  Principle Diagnosis: 49 year old woman with Iron deficiency anemia diagnosed in April of 2015. She presented with a hemoglobin of 10.8, MCV of 78 and ferritin of 8. Iron deficiency likely related to menstrual losses.  Prior Therapy: Feraheme infusion last given 10/20/2013  Current therapy: Observation  Interim History:  Ms. Joanne Robertson presents today for a followup visit. Since the last visit, she continues to do very well. She does report intermittent abdominal pain but have not really changed since the last evaluation.  She underwent endoscopy as well as colonoscopy in April of 2015 with no clear-cut GI source of bleeding or any source of her pain at that time. She received feraheme infusion on 10/20/2013 and have not required any other treatment since. She is currently not experiencing any overt bleeding or bruising. She does not report any fatigue tiredness or decline in her performance status. She denies any fever, chills, cough, hemoptysis. She's had no hematochezia or hematemesis. She does not report any skeletal complaints. She does not report any epistaxis or lymphadenopathy. Remainder of the review of systems is unremarkable.  Medications: I personally reviewed her medication list.    Allergies:  Allergies  Allergen Reactions  . Penicillins     REACTION: rash, itching  . Promethazine Hcl     REACTION: agitated  . Sulfonamide Derivatives     REACTION: rash, itching    Past Medical History, Surgical history, Social history, and Family History were reviewed and updated.    Physical Exam: Blood pressure 120/71, pulse 70, temperature 98.1 F (36.7 C), temperature source Oral, resp. rate 18, height 5' (1.524 m), weight 122 lb 6.4 oz (55.52 kg), last menstrual period 06/27/2014, SpO2 100 %. ECOG: 0 General appearance: alert awake not in any distress. Head: Normocephalic, without  obvious abnormality Mouth:clear, no evidence of thrush or mucositis Neck: no adenopathy Lymph nodes: Cervical, supraclavicular, and axillary nodes normal. Resp: clear to auscultation bilaterally Cardio: regular rate and rhythm, S1, S2 normal, no murmur, click, rub or gallop  GI: soft, non-tender; bowel sounds normal; no masses,  no organomegaly Extremities: extremities normal, atraumatic, no cyanosis or edema    Lab Results: Lab Results  Component Value Date   WBC 4.4 08/16/2014   HGB 12.6 08/16/2014   HCT 39.9 08/16/2014   MCV 91.4 08/16/2014   PLT 211 08/16/2014     Chemistry      Component Value Date/Time   NA 137 04/14/2014 1323   NA 137 05/10/2013 1021   K 3.8 04/14/2014 1323   K 4.2 05/10/2013 1021   CL 100 05/10/2013 1021   CO2 27 04/14/2014 1323   CO2 29 05/10/2013 1021   BUN 18.6 04/14/2014 1323   BUN 11 05/10/2013 1021   CREATININE 0.8 04/14/2014 1323   CREATININE 0.71 05/10/2013 1021   CREATININE 1.0 10/24/2007 1341      Component Value Date/Time   CALCIUM 9.4 04/14/2014 1323   CALCIUM 9.7 05/10/2013 1021   ALKPHOS 62 04/14/2014 1323   ALKPHOS 63 05/10/2013 1021   AST 16 04/14/2014 1323   AST 22 05/10/2013 1021   ALT 12 04/14/2014 1323   ALT 13 05/10/2013 1021   BILITOT 0.49 04/14/2014 1323   BILITOT 0.7 05/10/2013 1021        Impression and Plan: 39 old  female with: 1. Iron deficiency anemia related to chronic blood loss related to her menstrual periods. She status post  IV iron infusion in April 2015 with normalization of her hemoglobin and iron studies. Since that time, she continued to have stable hemoglobin and iron stores without any further loss. At this time, I see no need for repeat IV iron. I advised her to continue follow-up with her primary care physician as she is doing and I'll be happy to see her in the future if she develops any worsening iron deficiency in the future. I advised her about symptoms of developing iron deficiency such as  excessive fatigue, tiredness by scraping etc. 2. Gastritis: She follows up with gastroenterology which could also contribute to her iron deficiency.     [facility]AD[facility],Bearl Talarico, MD 2/16/20169:10 AM

## 2014-09-07 ENCOUNTER — Telehealth: Payer: Self-pay | Admitting: *Deleted

## 2014-09-07 MED ORDER — DIAZEPAM 2 MG PO TABS: ORAL_TABLET | ORAL | Status: DC

## 2014-09-07 MED ORDER — ALPRAZOLAM 0.5 MG PO TABS: ORAL_TABLET | ORAL | Status: DC

## 2014-09-07 NOTE — Telephone Encounter (Signed)
Called #1 Xanax to  [id number] - [place], [place] - [address]

## 2014-09-07 NOTE — Telephone Encounter (Signed)
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   Please advise.   Thanks MH

## 2014-09-07 NOTE — Telephone Encounter (Signed)
Pt called back stating she can't take Xanax during the day because it makes her too drowsy. 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 She wanted only one tab to take half on day one of presentation and the other half tab on day two of the presentation. Please advise if we can call this in.   Thanks! MH

## 2014-09-07 NOTE — Addendum Note (Signed)
Addended by: Raechel ChuteSCHOENHOFF, Owen Pratte D on: 09/07/2014 10:15 AM   Modules accepted: Orders

## 2014-09-07 NOTE — Telephone Encounter (Signed)
error 

## 2014-09-12 ENCOUNTER — Encounter: Payer: BLUE CROSS/BLUE SHIELD | Admitting: Internal Medicine

## 2014-09-12 NOTE — Progress Notes (Signed)
Subjective:    Patient ID: Joanne Robertson, female    DOB: 04/12/1966, 49 y.o.   MRN: [id number]  HPI 08/2014 hematology note Principle Diagnosis: 49 year old woman with Iron deficiency anemia diagnosed in April of 2015. She presented with a hemoglobin of 10.8, MCV of 78 and ferritin of 8. Iron deficiency likely related to menstrual losses.  Prior Therapy: Feraheme infusion last given 10/20/2013  Current therapy: Observation  Interim History: Joanne Robertson presents today for a followup visit. Since the last visit, she continues to do very well. She does report intermittent abdominal pain but have not really changed since the last evaluation. She underwent endoscopy as well as colonoscopy in April of 2015 with no clear-cut GI source of bleeding or any source of her pain at that time. She received feraheme infusion on 10/20/2013 and have not required any other treatment since. She is currently not experiencing any overt bleeding or bruising. She does not report any fatigue tiredness or decline in her performance status. She denies any fever, chills, cough, hemoptysis. She's had no hematochezia or hematemesis. She does not report any skeletal complaints. She does not report any epistaxis or lymphadenopathy. Remainder of the review of systems is unremarkable.  Impression and Plan: [age] old female with: 1. Iron deficiency anemia related to chronic blood loss related to her menstrual periods. She status post IV iron infusion in April 2015 with normalization of her hemoglobin and iron studies. Since that time, she continued to have stable hemoglobin and iron stores without any further loss. At this time, I see no need for repeat IV iron. I advised her to continue follow-up with her primary care physician as she is doing and I'll be happy to see her in the future if she develops any worsening iron deficiency in the future. I advised her about symptoms of developing iron deficiency such as excessive fatigue,  tiredness by scraping etc. 2. Gastritis: She follows up with gastroenterology which could also contribute to her iron deficiency.  TODAY:  Joanne Robertson is here for follow up   Allergies  Allergen Reactions  . Penicillins     REACTION: rash, itching  . Promethazine Hcl     REACTION: agitated  . Sulfonamide Derivatives     REACTION: rash, itching   Past Medical History  Diagnosis Date  . GERD (gastroesophageal reflux disease)   . IBS (irritable bowel syndrome)   . Clostridium difficile infection 2004  . Lymphocytic colitis   . Esophageal stricture 03/2009  . Giardia   . Anemia   . Allergy     SEASONAL  . Arthritis    Past Surgical History  Procedure Laterality Date  . Cesarean section     History   Social History  . Marital Status: Married    Spouse Name: N/A  . Number of Children: N/A  . Years of Education: N/A   Occupational History  . Not on file.   Social History Main Topics  . Smoking status: Former Smoker    Types: Cigarettes  . Smokeless tobacco: Never Used  . Alcohol Use: Yes     Comment: 4 glasses per week  . Drug Use: No  . Sexual Activity: No   Other Topics Concern  . Not on file   Social History Narrative   Family History  Problem Relation Age of Onset  . Breast cancer Maternal Aunt     x 2  . Breast cancer Maternal Grandmother   . Diabetes Father    Patient  Active Problem List   Diagnosis Date Noted  . Dizziness 07/18/2014  . Iron deficiency anemia due to chronic blood loss 10/14/2013  . ANA positive 04/30/2012  . History of abnormal mammogram 03/11/2012  . History of anemia 03/11/2012  . History of abnormal Pap smear 03/11/2012  . History of cervical polypectomy 03/11/2012  . Family history of breast cancer 03/11/2012  . Giardia   . ESOPHAGEAL STRICTURE 04/11/2009  . NAUSEA ALONE 03/13/2009  . DYSPHAGIA 03/13/2009  . FLATULENCE-GAS-BLOATING 03/13/2009  . OTH&UNSPEC NONINFECTIOUS GASTROENTERITIS&COLITIS 09/02/2008  . FUNCTIONAL  DIARRHEA 05/30/2008  . GERD 12/01/2007  . IBS 12/01/2007  . DIARRHEA 12/01/2007   Current Outpatient Prescriptions on File Prior to Visit  Medication Sig Dispense Refill  . ALPRAZolam (XANAX) 0.5 MG tablet Take one tablet 30 mins prior to performance 1 tablet 0  . diazepam (VALIUM) 2 MG tablet Take 1/2 to one tablet 30 mins prior to perfomance 2 tablet 0  . FLUoxetine (PROZAC) 20 MG capsule TAKE ONE CAPSULE BY MOUTH EVERY MORNING 90 capsule 1  . omeprazole (PRILOSEC) 20 MG capsule TAKE ONE CAPSULE BY MOUTH TWICE DAILY 30 MINUTES BEFORE BREAKFAST AND DINNER 180 capsule 1  . Probiotic Product (PROBIOTIC DAILY PO) Take 1 tablet by mouth daily.     No current facility-administered medications on file prior to visit.        Review of Systems See HPI    Objective:   Physical Exam Physical Exam  Nursing note and vitals reviewed.  Constitutional: She is oriented to person, place, and time. She appears well-developed and well-nourished.  HENT:  Head: Normocephalic and atraumatic.  Cardiovascular: Normal rate and regular rhythm. Exam reveals no gallop and no friction rub.  No murmur heard.  Pulmonary/Chest: Breath sounds normal. She has no wheezes. She has no rales.  Neurological: She is alert and oriented to person, place, and time.  Skin: Skin is warm and dry.  Psychiatric: She has a normal mood and affect. Her behavior is normal.              Assessment & Plan:

## 2014-09-19 ENCOUNTER — Other Ambulatory Visit: Payer: Self-pay | Admitting: Gastroenterology

## 2014-09-28 ENCOUNTER — Other Ambulatory Visit: Payer: Self-pay | Admitting: Internal Medicine

## 2014-10-27 ENCOUNTER — Encounter: Payer: Self-pay | Admitting: Internal Medicine

## 2014-10-27 ENCOUNTER — Ambulatory Visit (INDEPENDENT_AMBULATORY_CARE_PROVIDER_SITE_OTHER): Payer: BLUE CROSS/BLUE SHIELD | Admitting: Internal Medicine

## 2014-10-27 VITALS — BP 107/69 | HR 71 | Resp 16 | Ht 60.0 in | Wt 110.0 lb

## 2014-10-27 DIAGNOSIS — R3915 Urgency of urination: Secondary | ICD-10-CM | POA: Diagnosis not present

## 2014-10-27 DIAGNOSIS — K219 Gastro-esophageal reflux disease without esophagitis: Secondary | ICD-10-CM | POA: Diagnosis not present

## 2014-10-27 DIAGNOSIS — L989 Disorder of the skin and subcutaneous tissue, unspecified: Secondary | ICD-10-CM

## 2014-10-27 LAB — POCT URINALYSIS DIPSTICK
BILIRUBIN UA: NEGATIVE
Blood, UA: NEGATIVE
Glucose, UA: NEGATIVE
Ketones, UA: NEGATIVE
LEUKOCYTES UA: NEGATIVE
Nitrite, UA: NEGATIVE
PH UA: 6.5
Protein, UA: NEGATIVE
Spec Grav, UA: 1.01
Urobilinogen, UA: NEGATIVE

## 2014-10-27 NOTE — Progress Notes (Signed)
Subjective:    Patient ID: Joanne Robertson, female    DOB: May 10, 1966, 49 y.o.   MRN: [id number]  HPI  08/16/2014 note Impression and Plan: 66 old female with: 1. Iron deficiency anemia related to chronic blood loss related to her menstrual periods. She status post IV iron infusion in April 2015 with normalization of her hemoglobin and iron studies. Since that time, she continued to have stable hemoglobin and iron stores without any further loss. At this time, I see no need for repeat IV iron. I advised her to continue follow-up with her primary care physician as she is doing and I'll be happy to see her in the future if she develops any worsening iron deficiency in the future. I advised her about symptoms of developing iron deficiency such as excessive fatigue, tiredness by scraping etc. 2. Gastritis: She follows up with gastroenterology which could also contribute to her iron deficiency.  07/18/2014 HM Will give TDAp today   Anemia: Managed by hematology   Dizziness Will refer to neuro rehab. See me in 8 weeks if no improvement will need imaging   Fh breast CA Insurance would not cover BRCA  Pos ANA she has rheumatologist in [facility] today See m in 8 weeks         TODAY:  Joanne Robertson is here for acute visit.    She has skin lesion on her back  Feeling more stressed  .  Caring for mother at home who is very sick and "wants to die"    Pt is not more depressed but would like to speak ot a counselor .      No further dizziness or vertigo symptoms  Some urinary urgency at end of day   Allergies  Allergen Reactions  . Penicillins     REACTION: rash, itching  . Promethazine Hcl     REACTION: agitated  . Sulfonamide Derivatives     REACTION: rash, itching   Past Medical History  Diagnosis Date  . GERD (gastroesophageal reflux disease)   . IBS (irritable bowel syndrome)   . Clostridium difficile infection 2004  . Lymphocytic colitis   . Esophageal stricture  03/2009  . Giardia   . Anemia   . Allergy     SEASONAL  . Arthritis    Past Surgical History  Procedure Laterality Date  . Cesarean section     History   Social History  . Marital Status: Married    Spouse Name: N/A  . Number of Children: N/A  . Years of Education: N/A   Occupational History  . Not on file.   Social History Main Topics  . Smoking status: Former Smoker    Types: Cigarettes  . Smokeless tobacco: Never Used  . Alcohol Use: Yes     Comment: 4 glasses per week  . Drug Use: No  . Sexual Activity: No   Other Topics Concern  . Not on file   Social History Narrative   Family History  Problem Relation Age of Onset  . Breast cancer Maternal Aunt     x 2  . Breast cancer Maternal Grandmother   . Diabetes Father    Patient Active Problem List   Diagnosis Date Noted  . Dizziness 07/18/2014  . Iron deficiency anemia due to chronic blood loss 10/14/2013  . ANA positive 04/30/2012  . History of abnormal mammogram 03/11/2012  . History of anemia 03/11/2012  . History of abnormal Pap smear 03/11/2012  . History  of cervical polypectomy 03/11/2012  . Family history of breast cancer 03/11/2012  . Giardia   . ESOPHAGEAL STRICTURE 04/11/2009  . NAUSEA ALONE 03/13/2009  . DYSPHAGIA 03/13/2009  . FLATULENCE-GAS-BLOATING 03/13/2009  . OTH&UNSPEC NONINFECTIOUS GASTROENTERITIS&COLITIS 09/02/2008  . FUNCTIONAL DIARRHEA 05/30/2008  . GERD 12/01/2007  . IBS 12/01/2007  . DIARRHEA 12/01/2007   Current Outpatient Prescriptions on File Prior to Visit  Medication Sig Dispense Refill  . ALPRAZolam (XANAX) 0.5 MG tablet Take one tablet 30 mins prior to performance 1 tablet 0  . diazepam (VALIUM) 2 MG tablet Take 1/2 to one tablet 30 mins prior to perfomance 2 tablet 0  . FLUoxetine (PROZAC) 20 MG capsule TAKE ONE CAPSULE BY MOUTH EVERY MORNING 90 capsule 1  . omeprazole (PRILOSEC) 20 MG capsule TAKE 1 CAPSULE BY MOUTH TWICE DAILY. (30 MINUTES BEFORE BREAKFAST AND  DINNER) 180 capsule 1  . Probiotic Product (PROBIOTIC DAILY PO) Take 1 tablet by mouth daily.     No current facility-administered medications on file prior to visit.      Review of Systems    see HPI Objective:   Physical Exam Physical Exam  Nursing note and vitals reviewed.  Constitutional: She is oriented to person, place, and time. She appears well-developed and well-nourished.  HENT:  Head: Normocephalic and atraumatic.  Cardiovascular: Normal rate and regular rhythm. Exam reveals no gallop and no friction rub.  No murmur heard.  Pulmonary/Chest: Breath sounds normal. She has no wheezes. She has no rales.  Neurological: She is alert and oriented to person, place, and time.  Skin: Skin is warm and dry.  Small < 1cm  sebacous cyst on bac Psychiatric: She has a normal mood and affect. Her behavior is normal.        Assessment & Plan:  Anemia  No further IV FE needed  Skin lesion advised to see her dermatologist  Esophagitis :   Continue PPI   Mixed anxiety/depression continue Prozac  And prn Xanax    Will refer to [place] counselor   U/A normal   Pt had received letter of my departure and advised to make appt with new PCP of choice

## 2014-11-17 ENCOUNTER — Encounter: Payer: Self-pay | Admitting: Gastroenterology

## 2015-07-16 ENCOUNTER — Other Ambulatory Visit: Payer: Self-pay | Admitting: Gastroenterology

## 2015-08-22 ENCOUNTER — Other Ambulatory Visit: Payer: Self-pay | Admitting: Gastroenterology

## 2015-10-02 ENCOUNTER — Ambulatory Visit
Admission: RE | Admit: 2015-10-02 | Discharge: 2015-10-02 | Disposition: A | Discharge status: Home / Self Care | Payer: BLUE CROSS/BLUE SHIELD | Source: Ambulatory Visit | Attending: Internal Medicine | Admitting: Internal Medicine

## 2015-10-02 ENCOUNTER — Other Ambulatory Visit: Payer: Self-pay | Admitting: Internal Medicine

## 2015-10-02 DIAGNOSIS — M542 Cervicalgia: Secondary | ICD-10-CM

## 2015-10-25 ENCOUNTER — Ambulatory Visit (INDEPENDENT_AMBULATORY_CARE_PROVIDER_SITE_OTHER): Payer: BLUE CROSS/BLUE SHIELD | Admitting: Sports Medicine

## 2015-10-25 ENCOUNTER — Encounter: Payer: Self-pay | Admitting: Sports Medicine

## 2015-10-25 VITALS — BP 120/72 | Ht 60.5 in | Wt 111.0 lb

## 2015-10-25 DIAGNOSIS — M5030 Other cervical disc degeneration, unspecified cervical region: Secondary | ICD-10-CM | POA: Diagnosis not present

## 2015-10-25 DIAGNOSIS — M542 Cervicalgia: Secondary | ICD-10-CM | POA: Diagnosis not present

## 2015-10-25 NOTE — Progress Notes (Signed)
   Subjective:    Patient ID: Joanne CivilAmanda M Monts, female    DOB: 01/08/1966, 50 y.o.   MRN: [id number]  HPI  chief complaint: Neck pain  Very pleasant 50 year old female comes in today at the request of her primary care physician for evaluation of neck pain. She denies any recent trauma but does recall a remote injury to her neck when she got "head butted" by a horse several years ago suffering a hyperextension type injury to her neck.. Although she had some mild pain at the time she does not remember it being too severe. A couple of months ago she awoke with a crick in the left side of her neck which has persisted. She has an aching type of discomfort that will radiate from the left side of her neck into her left scapula. It tends to be worse at night as well as first thing in the morning. 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 Her primary care physician placed her on prednisone but that did not help. She has also been prescribed 800 mg of ibuprofen which she takes as needed in the morning and this does help somewhat. She has not tried ice or heat. She has not had physical therapy. She denies radiating pain into her arm. She denies any associated numbness or tingling. She recently had cervical spine x-rays done which are available for review. No prior neck surgeries. She denies deep-seated shoulder pain.  Past medical history reviewed Medications reviewed Allergies reviewed    Review of Systems    as above Objective:   Physical Exam  Well-developed, well-nourished. No acute distress. Awake alert and oriented 3. Vital signs reviewed  Cervical spine: Patient has limited cervical rotation to the left by about 20-30. Full cervical rotation to the right. She has full flexion and extension but does have pain with extension. She has no tenderness to palpation along cervical midline. Slight tenderness diffusely along the left paraspinal musculature and into the parascapular  area.  Neurological exam: Strength is 5/5 both upper extremities. No atrophy. Reflexes are trace but equal at the biceps, triceps, and brachial radialis tendons. Sensation is intact to light touch grossly.  Cervical spine x-rays are reviewed. They're compared to x-rays from 2013. Patient has advanced disc space narrowing at C5-C6 which appears stable when compared to the x-ray from 2013. Mild narrowing at C4-C5. Findings are consistent with degenerative disc disease.      Assessment & Plan:   Neck pain secondary to C5-C6 cervical degenerative disc disease  I discussed the patient's treatment options including continuing with oral anti-inflammatories, physical therapy, injections, and MRI. Her symptoms are currently tolerable so I do not think we need to get aggressive with workup or treatment. I think she is okay to continue with her 800 mg ibuprofen as needed but she is cautioned about GI upset and I do not want her to take it long-term. We will set up some physical therapy and they can educate her in a home exercise program. I would also like for them to perform an ergonomic evaluation for her to apply to her work space. I recommended that she try moist heat as needed. Hopefully symptoms will begin to improve. If not, we discussed further diagnostic imaging in the form of an MRI and more aggressive treatment such facet injections or possibly epidurals. Otherwise, if this is something she can live with them that is what I would recommend. Follow-up with me as needed.

## 2015-11-08 ENCOUNTER — Ambulatory Visit: Payer: BLUE CROSS/BLUE SHIELD | Attending: Sports Medicine

## 2015-11-08 DIAGNOSIS — R293 Abnormal posture: Secondary | ICD-10-CM | POA: Diagnosis present

## 2015-11-08 DIAGNOSIS — R252 Cramp and spasm: Secondary | ICD-10-CM | POA: Insufficient documentation

## 2015-11-08 DIAGNOSIS — M542 Cervicalgia: Secondary | ICD-10-CM | POA: Diagnosis not present

## 2015-11-08 NOTE — Patient Instructions (Signed)
PERFORM ALL EXERCISES GENTLY AND WITH GOOD POSTURE.    20 SECOND HOLD, 3 REPS TO EACH SIDE. 4-5 TIMES EACH DAY.   AROM: Neck Rotation   Turn head slowly to look over one shoulder, then the other.   AROM: Neck Flexion   Bend head forward.   AROM: Lateral Neck Flexion   Slowly tilt head toward one shoulder, then the other.   Posture - Standing   Good posture is important. Avoid slouching and forward head thrust. Maintain curve in low back and align ears over shoulders, hips over ankles.  Pull your belly button in toward your back bone. Posture Tips DO: - stand tall and erect - keep chin tucked in - keep head and shoulders in alignment - check posture regularly in mirror or large window - pull head back against headrest in car seat;  Change your position often.  Sit with lumbar support. DON'T: - slouch or slump while watching TV or reading - sit, stand or lie in one position  for too long;  Sitting is especially hard on the spine so if you sit at a desk/use the computer, then stand up often! Copyright  VHI. All rights reserved.  Posture - Sitting foot rest! Sit upright, head facing forward. Try using a roll to support lower back. Keep shoulders relaxed, and avoid rounded back. Keep hips level with knees. Avoid crossing legs for long periods. Copyright  VHI. All rights reserved.  Chronic neck strain can develop because of poor posture and faulty work habits  Postural strain related to slumped sitting and forward head posture is a leading cause of headaches, neck and upper back pain  General strengthening and flexibility exercises are helpful in the treatment of neck pain.  Most importantly, you should learn to correct the posture that may be contributing to chronic pain.   Change positions frequently  Change your work or home environment to improve posture and mechanics.  [facility] [address] [place], [place] [address] Phone # [phone] Fax  [phone]

## 2015-11-08 NOTE — Therapy (Addendum)
[facility] [address] [place], [place], [address] Phone: [phone]   Fax:  [phone]  Physical Therapy Evaluation  Patient Details  Name: Joanne Robertson MRN: [id number] Date of Birth: [birth year] Referring Provider: Lilia Argue, MD  Encounter Date: 11/08/2015      PT End of Session - 11/08/15 0839    Visit Number 1   Date for PT Re-Evaluation 01/03/16   PT Start Time 0815  pt late for appt   PT Stop Time 0845   PT Time Calculation (min) 30 min   Activity Tolerance Patient tolerated treatment well   Behavior During Therapy [facility] for tasks assessed/performed      Past Medical History  Diagnosis Date  . GERD (gastroesophageal reflux disease)   . IBS (irritable bowel syndrome)   . Clostridium difficile infection 2004  . Lymphocytic colitis   . Esophageal stricture 03/2009  . Giardia   . Anemia   . Allergy     SEASONAL  . Arthritis     Past Surgical History  Procedure Laterality Date  . Cesarean section      There were no vitals filed for this visit.       Subjective Assessment - 11/08/15 0818    Subjective Pt presents to PT with complaints of chronic neck pain lasting 3-4 months.  Pt denies any recent incident or injury.  Pt reports that she woke up with stiffness in neck.     Pertinent History oral steroid 6 weeks ago- not change   Diagnostic tests old x-ray: spur in cervical spine per pt report   Patient Stated Goals reduce pain   Currently in Pain? Yes   Pain Score 3    Pain Location Neck   Pain Orientation Left;Posterior   Pain Descriptors / Indicators Sore;Tightness   Pain Type Chronic pain   Pain Onset More than a month ago   Pain Frequency Constant   Aggravating Factors  sleep at night, lifting head from pillow, looking up, looking over shoulder with driving   Pain Relieving Factors Advil/Tylenol            [facility] PT Assessment - 11/08/15 0001    Assessment   Medical  Diagnosis neck pain   Referring Provider Lilia Argue, MD   Onset Date/Surgical Date 07/11/15   Next MD Visit none scheduled   Precautions   Precautions None   Restrictions   Weight Bearing Restrictions No   Balance Screen   Has the patient fallen in the past 6 months No   Has the patient had a decrease in activity level because of a fear of falling?  No   Is the patient reluctant to leave their home because of a fear of falling?  No   Home Ecologist residence   Prior Function   Level of Independence Independent   Vocation Part time employment   Vocation Requirements desk work   Leisure ride horses, walking   Cognition   Overall Cognitive Status Within Functional Limits for tasks assessed   Observation/Other Assessments   Focus on Therapeutic Outcomes (FOTO)  34% limitation   Posture/Postural Control   Posture/Postural Control Postural limitations   Postural Limitations Forward head;Rounded Shoulders   ROM / Strength   AROM / PROM / Strength AROM;PROM;Strength   AROM   Overall AROM  Deficits   Overall AROM Comments cervical flexion is full with Lt neck pain, extension limited by 50%, sidebending to the  Rt is limited by 10% with Lt neck pain, rotation is full bil.     PROM   Overall PROM  Within functional limits for tasks performed   Overall PROM Comments pain with Lt cervical rotation   Strength   Overall Strength Within functional limits for tasks performed   Overall Strength Comments 5/5 UE strength   Palpation   Spinal mobility reduced PA mobllity C3-5   Palpation comment active trigger points in Lt cervical paraspinals, UT and suboccipitals   Ambulation/Gait   Ambulation/Gait Yes   Gait Pattern Within Functional Limits                           PT Education - 11/08/15 0837    Education provided Yes   Education Details posture, neck AROM   Person(s) Educated Patient   Methods Explanation;Demonstration;Handout    Comprehension Verbalized understanding;Returned demonstration          PT Short Term Goals - 11/08/15 0843    PT SHORT TERM GOAL #1   Title be independent in initial HEP   Time 4   Period Weeks   Status New   PT SHORT TERM GOAL #2   Title demonstrate good seated posture and report corrections at home   Time 4   Period Weeks   Status New   PT SHORT TERM GOAL #3   Title report a 30% reduction in neck pain with home and work tasks   Time 4   Period Weeks   Status New           PT Long Term Goals - 11/08/15 0810    PT LONG TERM GOAL #1   Title be independent in advanced HEP   Time 8   Period Weeks   Status New   PT LONG TERM GOAL #2   Title reduce FOTO to < or = to 26% limitation   Time 8   Period Weeks   Status New   PT LONG TERM GOAL #3   Title report a 60% reduciton in neck pain with home and work tasks   Time 8   Period Weeks   Status New   PT LONG TERM GOAL #4   Title demonstrate full cervical AROM without increase in Lt neck pain    Time 8   Period Weeks   Status New               Plan - 11/08/15 6606    Clinical Impression Statement Pt presents to PT with onset of Lt sided neck pain that began 3-4 months ago without cause.  Pt denies any radiculopathy.  Pt demonstrates painful and limited cervical AROM, FOTO score of 34% limitation, active trigger points in Lt neck and UT and posutral abnormality/weakness.  Pt will benefit from skilled PT for postural retraining, cervical AROM, manual/dry needling, and modalities as needed for pain   Rehab Potential Good   PT Frequency 2x / week   PT Duration 8 weeks   PT Treatment/Interventions ADLs/Self Care Home Management;Cryotherapy;Electrical Stimulation;Moist Heat;Therapeutic exercise;Therapeutic activities;Functional mobility training;Neuromuscular re-education;Patient/family education;Manual techniques;Taping;Dry needling;Passive range of motion   PT Next Visit Plan postural strength, manual/dry  needling, cervical flexiblity, modalities   Consulted and Agree with Plan of Care Patient      Patient will benefit from skilled therapeutic intervention in order to improve the following deficits and impairments:  Pain, Postural dysfunction, Impaired flexibility, Improper body mechanics, Decreased activity tolerance, Decreased range  of motion  Visit Diagnosis: Cervicalgia - Plan: PT plan of care cert/re-cert  Cramp and spasm - Plan: PT plan of care cert/re-cert  Abnormal posture - Plan: PT plan of care cert/re-cert     Problem List Patient Active Problem List   Diagnosis Date Noted  . Dizziness 07/18/2014  . Iron deficiency anemia due to chronic blood loss 10/14/2013  . ANA positive 04/30/2012  . History of abnormal mammogram 03/11/2012  . History of anemia 03/11/2012  . History of abnormal Pap smear 03/11/2012  . History of cervical polypectomy 03/11/2012  . Family history of breast cancer 03/11/2012  . Giardia   . ESOPHAGEAL STRICTURE 04/11/2009  . NAUSEA ALONE 03/13/2009  . DYSPHAGIA 03/13/2009  . FLATULENCE-GAS-BLOATING 03/13/2009  . OTH&UNSPEC NONINFECTIOUS GASTROENTERITIS&COLITIS 09/02/2008  . FUNCTIONAL DIARRHEA 05/30/2008  . GERD 12/01/2007  . IBS 12/01/2007  . DIARRHEA 12/01/2007     Sigurd Sos, PT 11/08/2015 9:37 AM PHYSICAL THERAPY DISCHARGE SUMMARY  Visits from Start of Care: 1  Current functional level related to goals / functional outcomes: Pt called to cancel all appts after evaluation due to co-pay.   Remaining deficits: See above for current status.     Education / Equipment: HEP, posture Plan: Patient agrees to discharge.  Patient goals were not met. Patient is being discharged due to financial reasons.  ?????   Sigurd Sos, PT 11/13/2015 1:53 PM   [facility] [address] [place], [place], [address] Phone: [phone]   Fax:  [phone]  Name: TANISA LAGACE MRN:  [id number] Date of Birth: 07/31/1965

## 2015-11-13 ENCOUNTER — Ambulatory Visit: Payer: BLUE CROSS/BLUE SHIELD

## 2015-11-15 ENCOUNTER — Ambulatory Visit: Payer: BLUE CROSS/BLUE SHIELD

## 2015-11-22 ENCOUNTER — Encounter: Payer: BLUE CROSS/BLUE SHIELD | Admitting: Physical Therapy

## 2015-12-06 ENCOUNTER — Encounter: Payer: BLUE CROSS/BLUE SHIELD | Admitting: Physical Therapy

## 2015-12-18 ENCOUNTER — Other Ambulatory Visit: Payer: Self-pay | Admitting: Internal Medicine

## 2015-12-18 ENCOUNTER — Ambulatory Visit
Admission: RE | Admit: 2015-12-18 | Discharge: 2015-12-18 | Disposition: A | Discharge status: Home / Self Care | Payer: BLUE CROSS/BLUE SHIELD | Source: Ambulatory Visit | Attending: Internal Medicine | Admitting: Internal Medicine

## 2015-12-18 ENCOUNTER — Other Ambulatory Visit: Payer: BLUE CROSS/BLUE SHIELD

## 2015-12-18 DIAGNOSIS — W19XXXA Unspecified fall, initial encounter: Secondary | ICD-10-CM

## 2017-02-18 ENCOUNTER — Encounter (HOSPITAL_COMMUNITY): Payer: Self-pay | Admitting: *Deleted

## 2017-02-18 ENCOUNTER — Other Ambulatory Visit: Payer: Self-pay | Admitting: Obstetrics and Gynecology

## 2017-02-19 NOTE — H&P (Signed)
NAME:  MAITLAND, ROLEN                 ACCOUNT NO.:  [id number]  MEDICAL RECORD NO.:  [id number]  LOCATION:                                 FACILITY:  PHYSICIAN:  Lenoard Aden, M.D.     DATE OF BIRTH:  DATE OF ADMISSION: DATE OF DISCHARGE:                             HISTORY & PHYSICAL   CHIEF COMPLAINT:  Perimenopausal abnormal uterine bleeding with structural lesion.  HISTORY OF PRESENT ILLNESS:  A 51 year old white female, G1, P1, with history of C-section, now presents for abnormal bleeding with structural lesion.  ALLERGIES:  She has allergies to PHENERGAN, SULFA DRUGS, and PENICILLIN.  MEDICATIONS:  Include omeprazole, multivitamin, and Prozac.  SOCIAL HISTORY:  She is a nonsmoker, nondrinker.  She denies domestic or physical violence.  FAMILY HISTORY:  Hypertension and breast cancer.  SURGICAL HISTORY:  C-section x1.  PHYSICAL EXAMINATION:  GENERAL:  Well-developed, well-nourished white female in no acute distress. HEENT:  Normal. NECK:  Supple.  Full range of motion. LUNGS:  Clear. HEART:  Regular rate and rhythm. ABDOMEN:  Soft, scaphoid, nontender. PELVIC:  Reveals a normal-size uterus.  No adnexal masses. EXTREMITIES:  There are no cords. NEUROLOGIC:  Nonfocal. SKIN:  Intact.  IMPRESSION: 1. Perimenopausal abnormal uterine bleeding with structural lesion by     saline sonohysterogram. 2. History of menometrorrhagia with secondary anemia.  PLAN:  To proceed with diagnostic hysteroscopy, D and C, MyoSure ablation, NovaSure endometrial ablation.  Risks of anesthesia, infection, bleeding, injury to surrounding organs with possible need for repair are discussed.  Delayed versus immediate complications to include bowel and bladder injury are noted.  The patient acknowledges and wishes to proceed, [facility] Surgery on 08/23.     Lenoard Aden, M.D.     RJT/MEDQ  D:  02/19/2017  T:  02/19/2017  Job:  [id number]

## 2017-02-20 ENCOUNTER — Ambulatory Visit (HOSPITAL_COMMUNITY)
Admission: RE | Admit: 2017-02-20 | Discharge: 2017-02-20 | Disposition: A | Discharge status: Home / Self Care | Payer: BLUE CROSS/BLUE SHIELD | Source: Ambulatory Visit | Attending: Obstetrics and Gynecology | Admitting: Obstetrics and Gynecology

## 2017-02-20 ENCOUNTER — Ambulatory Visit (HOSPITAL_COMMUNITY): Payer: BLUE CROSS/BLUE SHIELD

## 2017-02-20 ENCOUNTER — Encounter (HOSPITAL_COMMUNITY): Payer: Self-pay | Admitting: *Deleted

## 2017-02-20 ENCOUNTER — Ambulatory Visit (HOSPITAL_COMMUNITY): Payer: BLUE CROSS/BLUE SHIELD | Admitting: Anesthesiology

## 2017-02-20 ENCOUNTER — Encounter (HOSPITAL_COMMUNITY)
Admission: RE | Disposition: A | Discharge status: Home / Self Care | Payer: Self-pay | Source: Ambulatory Visit | Attending: Obstetrics and Gynecology

## 2017-02-20 DIAGNOSIS — K219 Gastro-esophageal reflux disease without esophagitis: Secondary | ICD-10-CM | POA: Insufficient documentation

## 2017-02-20 DIAGNOSIS — N938 Other specified abnormal uterine and vaginal bleeding: Secondary | ICD-10-CM | POA: Diagnosis not present

## 2017-02-20 DIAGNOSIS — Z419 Encounter for procedure for purposes other than remedying health state, unspecified: Secondary | ICD-10-CM

## 2017-02-20 DIAGNOSIS — N840 Polyp of corpus uteri: Secondary | ICD-10-CM | POA: Diagnosis not present

## 2017-02-20 DIAGNOSIS — Z87891 Personal history of nicotine dependence: Secondary | ICD-10-CM | POA: Insufficient documentation

## 2017-02-20 DIAGNOSIS — N921 Excessive and frequent menstruation with irregular cycle: Secondary | ICD-10-CM | POA: Diagnosis present

## 2017-02-20 DIAGNOSIS — Z79899 Other long term (current) drug therapy: Secondary | ICD-10-CM | POA: Insufficient documentation

## 2017-02-20 DIAGNOSIS — Z880 Allergy status to penicillin: Secondary | ICD-10-CM | POA: Diagnosis not present

## 2017-02-20 DIAGNOSIS — D649 Anemia, unspecified: Secondary | ICD-10-CM | POA: Diagnosis not present

## 2017-02-20 HISTORY — PX: DILATATION & CURETTAGE/HYSTEROSCOPY WITH MYOSURE: SHX6511

## 2017-02-20 HISTORY — PX: HYSTEROSCOPY WITH NOVASURE: SHX5574

## 2017-02-20 LAB — BASIC METABOLIC PANEL
Anion gap: 9 (ref 5–15)
BUN: 14 mg/dL (ref 6–20)
CHLORIDE: 101 mmol/L (ref 101–111)
CO2: 26 mmol/L (ref 22–32)
CREATININE: 0.61 mg/dL (ref 0.44–1.00)
Calcium: 9.7 mg/dL (ref 8.9–10.3)
GFR calc Af Amer: >60 mL/min (ref >60)
GFR calc non Af Amer: >60 mL/min (ref >60)
Glucose, Bld: 99 mg/dL (ref 65–99)
Potassium: 4.3 mmol/L (ref 3.5–5.1)
Sodium: 136 mmol/L (ref 135–145)

## 2017-02-20 LAB — CBC
HEMATOCRIT: 36 % (ref 36.0–46.0)
Hemoglobin: 11.7 g/dL — ABNORMAL LOW (ref 12.0–15.0)
MCH: 27.1 pg (ref 26.0–34.0)
MCHC: 32.5 g/dL (ref 30.0–36.0)
MCV: 83.5 fL (ref 78.0–100.0)
PLATELETS: 306 10*3/uL (ref 150–400)
RBC: 4.31 MIL/uL (ref 3.87–5.11)
RDW: 13.7 % (ref 11.5–15.5)
WBC: 4.9 10*3/uL (ref 4.0–10.5)

## 2017-02-20 SURGERY — DILATATION & CURETTAGE/HYSTEROSCOPY WITH MYOSURE
Anesthesia: General | Site: Vagina

## 2017-02-20 MED ORDER — KETOROLAC TROMETHAMINE 30 MG/ML IJ SOLN
INTRAMUSCULAR | Status: AC
  Filled 2017-02-20: qty 1

## 2017-02-20 MED ORDER — OXYCODONE HCL 5 MG PO TABS: 5.0000 mg | ORAL_TABLET | Freq: Once | ORAL | Status: DC | PRN

## 2017-02-20 MED ORDER — SCOPOLAMINE 1 MG/3DAYS TD PT72
MEDICATED_PATCH | TRANSDERMAL | Status: AC
  Administered 2017-02-20: 1.5 mg via TRANSDERMAL
  Filled 2017-02-20: qty 1

## 2017-02-20 MED ORDER — LIDOCAINE HCL (CARDIAC) 20 MG/ML IV SOLN
INTRAVENOUS | Status: DC | PRN
  Administered 2017-02-20: 80 mg via INTRAVENOUS

## 2017-02-20 MED ORDER — PROPOFOL 10 MG/ML IV BOLUS
INTRAVENOUS | Status: DC | PRN
  Administered 2017-02-20: 20 mg via INTRAVENOUS
  Administered 2017-02-20: 180 mg via INTRAVENOUS

## 2017-02-20 MED ORDER — SCOPOLAMINE 1 MG/3DAYS TD PT72
1.0000 | MEDICATED_PATCH | Freq: Once | TRANSDERMAL | Status: DC
  Administered 2017-02-20: 1.5 mg via TRANSDERMAL

## 2017-02-20 MED ORDER — VASOPRESSIN 20 UNIT/ML IV SOLN
INTRAVENOUS | Status: AC
  Filled 2017-02-20: qty 1

## 2017-02-20 MED ORDER — PROPOFOL 10 MG/ML IV BOLUS
INTRAVENOUS | Status: AC
  Filled 2017-02-20: qty 20

## 2017-02-20 MED ORDER — LIDOCAINE HCL (CARDIAC) 20 MG/ML IV SOLN
INTRAVENOUS | Status: AC
  Filled 2017-02-20: qty 5

## 2017-02-20 MED ORDER — BUPIVACAINE HCL (PF) 0.25 % IJ SOLN
INTRAMUSCULAR | Status: DC | PRN
  Administered 2017-02-20: 20 mL

## 2017-02-20 MED ORDER — FENTANYL CITRATE (PF) 100 MCG/2ML IJ SOLN
INTRAMUSCULAR | Status: DC | PRN
  Administered 2017-02-20: 25 ug via INTRAVENOUS
  Administered 2017-02-20: 75 ug via INTRAVENOUS

## 2017-02-20 MED ORDER — ONDANSETRON HCL 4 MG/2ML IJ SOLN
INTRAMUSCULAR | Status: AC
  Filled 2017-02-20: qty 2

## 2017-02-20 MED ORDER — BUPIVACAINE HCL (PF) 0.25 % IJ SOLN
INTRAMUSCULAR | Status: AC
  Filled 2017-02-20: qty 30

## 2017-02-20 MED ORDER — MIDAZOLAM HCL 2 MG/2ML IJ SOLN
INTRAMUSCULAR | Status: AC
  Filled 2017-02-20: qty 2

## 2017-02-20 MED ORDER — CEFAZOLIN SODIUM-DEXTROSE 2-4 GM/100ML-% IV SOLN
INTRAVENOUS | Status: AC
  Filled 2017-02-20: qty 100

## 2017-02-20 MED ORDER — OXYCODONE HCL 5 MG/5ML PO SOLN: 5.0000 mg | Freq: Once | ORAL | Status: DC | PRN

## 2017-02-20 MED ORDER — MEPERIDINE HCL 25 MG/ML IJ SOLN: 6.2500 mg | INTRAMUSCULAR | Status: DC | PRN

## 2017-02-20 MED ORDER — CEFAZOLIN SODIUM-DEXTROSE 2-4 GM/100ML-% IV SOLN
2.0000 g | INTRAVENOUS | Status: AC
  Administered 2017-02-20: 2 g via INTRAVENOUS

## 2017-02-20 MED ORDER — SODIUM CHLORIDE 0.9 % IJ SOLN
INTRAMUSCULAR | Status: AC
  Filled 2017-02-20: qty 50

## 2017-02-20 MED ORDER — KETOROLAC TROMETHAMINE 30 MG/ML IJ SOLN
INTRAMUSCULAR | Status: DC | PRN
  Administered 2017-02-20: 30 mg via INTRAVENOUS

## 2017-02-20 MED ORDER — DEXAMETHASONE SODIUM PHOSPHATE 10 MG/ML IJ SOLN
INTRAMUSCULAR | Status: DC | PRN
  Administered 2017-02-20: 10 mg via INTRAVENOUS

## 2017-02-20 MED ORDER — ONDANSETRON HCL 4 MG/2ML IJ SOLN
INTRAMUSCULAR | Status: DC | PRN
  Administered 2017-02-20: 4 mg via INTRAVENOUS

## 2017-02-20 MED ORDER — MIDAZOLAM HCL 2 MG/2ML IJ SOLN
INTRAMUSCULAR | Status: DC | PRN
  Administered 2017-02-20: 2 mg via INTRAVENOUS

## 2017-02-20 MED ORDER — DEXAMETHASONE SODIUM PHOSPHATE 10 MG/ML IJ SOLN
INTRAMUSCULAR | Status: AC
  Filled 2017-02-20: qty 1

## 2017-02-20 MED ORDER — SODIUM CHLORIDE 0.9 % IR SOLN
Status: DC | PRN
  Administered 2017-02-20: 3000 mL

## 2017-02-20 MED ORDER — TRAMADOL HCL 50 MG PO TABS: 50.0000 mg | ORAL_TABLET | Freq: Four times a day (QID) | ORAL | 0 refills | Status: DC | PRN

## 2017-02-20 MED ORDER — LACTATED RINGERS IV SOLN
INTRAVENOUS | Status: DC
  Administered 2017-02-20 (×2): via INTRAVENOUS

## 2017-02-20 MED ORDER — FENTANYL CITRATE (PF) 250 MCG/5ML IJ SOLN
INTRAMUSCULAR | Status: AC
  Filled 2017-02-20: qty 5

## 2017-02-20 MED ORDER — HYDROMORPHONE HCL 1 MG/ML IJ SOLN: 0.2500 mg | INTRAMUSCULAR | Status: DC | PRN

## 2017-02-20 SURGICAL SUPPLY — 23 items
ABLATOR ENDOMETRIAL BIPOLAR (ABLATOR) ×2 IMPLANT
CANISTER SUCT 3000ML PPV (MISCELLANEOUS) ×4 IMPLANT
CATH ROBINSON RED A/P 16FR (CATHETERS) ×2 IMPLANT
CLOTH BEACON ORANGE TIMEOUT ST (SAFETY) ×2 IMPLANT
CONTAINER PREFILL 10% NBF 60ML (FORM) ×2 IMPLANT
DECANTER SPIKE VIAL GLASS SM (MISCELLANEOUS) ×4 IMPLANT
DEVICE MYOSURE LITE (MISCELLANEOUS) ×2 IMPLANT
DEVICE MYOSURE REACH (MISCELLANEOUS) IMPLANT
FILTER ARTHROSCOPY CONVERTOR (FILTER) ×2 IMPLANT
GLOVE BIO SURGEON STRL SZ7.5 (GLOVE) ×2 IMPLANT
GLOVE BIOGEL PI IND STRL 7.0 (GLOVE) ×1 IMPLANT
GLOVE BIOGEL PI INDICATOR 7.0 (GLOVE) ×1
GOWN STRL REUS W/TWL LRG LVL3 (GOWN DISPOSABLE) ×4 IMPLANT
NEEDLE SPNL 22GX3.5 QUINCKE BK (NEEDLE) ×4 IMPLANT
PACK VAGINAL MINOR WOMEN LF (CUSTOM PROCEDURE TRAY) ×2 IMPLANT
PAD OB MATERNITY 4.3X12.25 (PERSONAL CARE ITEMS) ×2 IMPLANT
PAD PREP 24X48 CUFFED NSTRL (MISCELLANEOUS) ×2 IMPLANT
SEAL ROD LENS SCOPE MYOSURE (ABLATOR) ×2 IMPLANT
SYR CONTROL 10ML LL (SYRINGE) ×2 IMPLANT
SYR TB 1ML 25GX5/8 (SYRINGE) ×2 IMPLANT
TOWEL OR 17X24 6PK STRL BLUE (TOWEL DISPOSABLE) ×4 IMPLANT
TUBING AQUILEX INFLOW (TUBING) ×2 IMPLANT
TUBING AQUILEX OUTFLOW (TUBING) ×2 IMPLANT

## 2017-02-20 NOTE — Progress Notes (Signed)
Patient ID: Joanne Robertson, female   DOB: 1966-05-29, 51 y.o.   MRN: [id number] Patient seen and examined. Consent witnessed and signed. No changes noted. Update completed.

## 2017-02-20 NOTE — Anesthesia Preprocedure Evaluation (Signed)
Anesthesia Evaluation  Patient identified by MRN, date of birth, ID band Patient awake    Reviewed: Allergy & Precautions, NPO status , Patient's Chart, lab work & pertinent test results  Airway Mallampati: II  TM Distance: >3 FB Neck ROM: Full    Dental no notable dental hx.    Pulmonary neg pulmonary ROS, former smoker,    Pulmonary exam normal breath sounds clear to auscultation       Cardiovascular negative cardio ROS Normal cardiovascular exam Rhythm:Regular Rate:Normal     Neuro/Psych negative neurological ROS  negative psych ROS   GI/Hepatic negative GI ROS, Neg liver ROS, GERD  Medicated,  Endo/Other  negative endocrine ROS  Renal/GU negative Renal ROS  negative genitourinary   Musculoskeletal negative musculoskeletal ROS (+) Arthritis ,   Abdominal   Peds negative pediatric ROS (+)  Hematology negative hematology ROS (+)   Anesthesia Other Findings Abnormal uterine bleeding  Reproductive/Obstetrics negative OB ROS                             Anesthesia Physical Anesthesia Plan  ASA: II  Anesthesia Plan: General   Post-op Pain Management:    Induction: Intravenous  PONV Risk Score and Plan: 3 and Ondansetron, Dexamethasone, Midazolam and Treatment may vary due to age or medical condition  Airway Management Planned: LMA  Additional Equipment:   Intra-op Plan:   Post-operative Plan: Extubation in OR  Informed Consent: I have reviewed the patients History and Physical, chart, labs and discussed the procedure including the risks, benefits and alternatives for the proposed anesthesia with the patient or authorized representative who has indicated his/her understanding and acceptance.   Dental advisory given  Plan Discussed with: CRNA  Anesthesia Plan Comments:         Anesthesia Quick Evaluation

## 2017-02-20 NOTE — Discharge Instructions (Addendum)
DISCHARGE INSTRUCTIONS: HYSTEROSCOPY / ENDOMETRIAL ABLATION The following instructions have been prepared to help you care for yourself upon your return home.  May Remove Scop patch on or before: Sunday August 26  May take Ibuprofen after: 2:30 pm today  May take stool softner while taking narcotic pain medication to prevent constipation.  Drink plenty of water.  Personal hygiene:  Use sanitary pads for vaginal drainage, not tampons.  Shower the day after your procedure.  NO tub baths, pools or Jacuzzis for 2-3 weeks.  Wipe front to back after using the bathroom.  Activity and limitations:  Do NOT drive or operate any equipment for 24 hours. The effects of anesthesia are still present and drowsiness may result.  Do NOT rest in bed all day.  Walking is encouraged.  Walk up and down stairs slowly.  You may resume your normal activity in one to two days or as indicated by your physician. Sexual activity: NO intercourse for at least 2 weeks after the procedure, or as indicated by your Doctor.  Diet: Eat a light meal as desired this evening. You may resume your usual diet tomorrow.  Return to Work: You may resume your work activities in one to two days or as indicated by Therapist, sports.  What to expect after your surgery: Expect to have vaginal bleeding/discharge for 2-3 days and spotting for up to 10 days. It is not unusual to have soreness for up to 1-2 weeks. You may have a slight burning sensation when you urinate for the first day. Mild cramps may continue for a couple of days. You may have a regular period in 2-6 weeks.  Call your doctor for any of the following:  Excessive vaginal bleeding or clotting, saturating and changing one pad every hour.  Inability to urinate 6 hours after discharge from hospital.  Pain not relieved by pain medication.  Fever of 100.4 F or greater.  Unusual vaginal discharge or odor.  Return to office _________________Call for an  appointment ___________________ Patients signature: ______________________ Nurses signature ________________________  Post Anesthesia Care Unit [phone]  Post Anesthesia Home Care Instructions  Activity: Get plenty of rest for the remainder of the day. A responsible individual must stay with you for 24 hours following the procedure.  For the next 24 hours, DO NOT: -Drive a car -Advertising copywriter -Drink alcoholic beverages -Take any medication unless instructed by your physician -Make any legal decisions or sign important papers.  Meals: Start with liquid foods such as gelatin or soup. Progress to regular foods as tolerated. Avoid greasy, spicy, heavy foods. If nausea and/or vomiting occur, drink only clear liquids until the nausea and/or vomiting subsides. Call your physician if vomiting continues.  Special Instructions/Symptoms: Your throat may feel dry or sore from the anesthesia or the breathing tube placed in your throat during surgery. If this causes discomfort, gargle with warm salt water. The discomfort should disappear within 24 hours.  If you had a scopolamine patch placed behind your ear for the management of post- operative nausea and/or vomiting:  1. The medication in the patch is effective for 72 hours, after which it should be removed.  Wrap patch in a tissue and discard in the trash. Wash hands thoroughly with soap and water. 2. You may remove the patch earlier than 72 hours if you experience unpleasant side effects which may include dry mouth, dizziness or visual disturbances. 3. Avoid touching the patch. Wash your hands with soap and water after contact with the patch.

## 2017-02-20 NOTE — Op Note (Signed)
02/20/2017  8:43 AM  PATIENT:  Joanne Robertson  51 y.o. female  PRE-OPERATIVE DIAGNOSIS:  Dysfunctional Uterine Bleeding Endometrial polyps Menometrorrhagia with secondary anemia  POST-OPERATIVE DIAGNOSIS:  Dysfunctional Uterine Bleeding And same  PROCEDURE:  Procedure(s): DILATATION & CURETTAGE DIAGNOSTIC HYSTEROSCOPY  MYOSURE RESECTION OF MULTIPLE ENDOMETRIAL POLYPS NOVASURE ENDOMETRIAL ABLATION  SURGEON:  Surgeon(s): Olivia Mackie, MD  ASSISTANTS: none   ANESTHESIA:   local and general  ESTIMATED BLOOD LOSS: minimal , Fluid deficit- 100cc  DRAINS: none   LOCAL MEDICATIONS USED:  MARCAINE    and Amount: 20 ml  SPECIMEN:  Source of Specimen:  emc and polyps  DISPOSITION OF SPECIMEN:  Source of Specimen:  EMC and polyps  To path  COUNTS:  YES  DICTATION #: [id number]  PLAN OF CARE: dc home  PATIENT DISPOSITION:  PACU - hemodynamically stable.

## 2017-02-20 NOTE — Anesthesia Postprocedure Evaluation (Signed)
Anesthesia Post Note  Patient: Joanne Robertson  Procedure(s) Performed: Procedure(s) (LRB): DILATATION & CURETTAGE/HYSTEROSCOPY WITH MYOSURE (N/A) HYSTEROSCOPY WITH NOVASURE (N/A)     Patient location during evaluation: PACU Anesthesia Type: General Level of consciousness: awake and alert Pain management: pain level controlled Vital Signs Assessment: post-procedure vital signs reviewed and stable Respiratory status: spontaneous breathing, nonlabored ventilation and respiratory function stable Cardiovascular status: blood pressure returned to baseline and stable Postop Assessment: no signs of nausea or vomiting Anesthetic complications: no    Last Vitals:  Vitals:   02/20/17 0945 02/20/17 1000  BP: 125/75   Pulse: 76 78  Resp: 12 15  Temp:  36.8 C  SpO2: 100% 100%    Last Pain:  Vitals:   02/20/17 1000  TempSrc:   PainSc: Asleep   Pain Goal: Patients Stated Pain Goal: 4 (02/20/17 1000)               Lowella Curb

## 2017-02-20 NOTE — Op Note (Signed)
NAMEVEVA, MALEK                ACCOUNT NO.:  [id number]  MEDICAL RECORD NO.:  [id number]  LOCATION:                                 FACILITY:  PHYSICIAN:  Lenoard Aden, M.D.     DATE OF BIRTH:  DATE OF PROCEDURE: DATE OF DISCHARGE:                              OPERATIVE REPORT   PREOPERATIVE DIAGNOSES:  Menometrorrhagia with secondary anemia, structural lesion on sonohysterogram.  POSTOPERATIVE DIAGNOSES:  Menometrorrhagia with secondary anemia, structural lesion on sonohysterogram, multiple endometrial polyps.  PROCEDURES:  Diagnostic hysteroscopy, MyoSure resection of multiple endometrial polyps, D and C, NovaSure endometrial ablation.  SURGEON:  Lenoard Aden, M.D.  ASSISTANT:  None.  ANESTHESIA:  Local and general.  ESTIMATED BLOOD LOSS:  Less than 50 mL.  FLUID DEFICIT:  100 mL.  COMPLICATIONS:  None.  DRAINS:  None.  COUNTS:  Correct.  SPECIMEN:  Endometrial curettings and multiple polyps to Pathology.  DISPOSITION:  The patient to recovery in good condition.  BRIEF OPERATIVE NOTE:  After being apprised of the risks of anesthesia, infection, bleeding, injury to surrounding organs, possible need for repair, delayed versus immediate complications to include bowel and bladder injury, possible need for repair, the patient brought to the operating room where she was administered a general anesthetic without complications.  Prepped and draped in usual sterile fashion. Catheterized until the bladder was empty.  Exam under anesthesia revealed acutely retroflexed uterus and no adnexal masses.  Dilute Marcaine solution placed, standard paracervical block, 20 mL total.  At this time, cervix with difficulty was dilated to a 23 Pratt dilator and os Finders used to establish the route of entry and the hysteroscope was placed.  Visualization revealed endometrial polyps as previously noted. MyoSure device was entered and resection of polypoid masses was  done without difficulty.  Minimal bleeding noted.  Bilateral normal tubal ostia noted, status post resection.  Normal endometrial cavity noted. MyoSure device removed.  At this time, D and C performed using sharp curettage in a 4-quadrant method.  NovaSure device was then placed, seated to a length of 6.5, a width of 4.5.  CO2 test performed, was negative.  Procedure was initiated to a power of 161 watts for 54 seconds.  Device was removed, inspected, and found to be intact. Revisualization of endometrial cavity, was a well ablated cavity with no evidence of uterine perforation.  The patient tolerated the procedure well, was awakened, and transferred to recovery room in good condition.     Lenoard Aden, M.D.     RJT/MEDQ  D:  02/20/2017  T:  02/20/2017  Job:  [id number]  cc:   Lenoard Aden, M.D. Fax: [phone]

## 2017-02-20 NOTE — Anesthesia Procedure Notes (Signed)
Procedure Name: LMA Insertion Date/Time: 02/20/2017 8:12 AM Performed by: Cephus Shelling A Pre-anesthesia Checklist: Patient being monitored, Patient identified, Emergency Drugs available and Suction available Patient Re-evaluated:Patient Re-evaluated prior to induction Oxygen Delivery Method: Circle system utilized Preoxygenation: Pre-oxygenation with 100% oxygen Induction Type: IV induction and Inhalational induction Ventilation: Mask ventilation without difficulty LMA: LMA inserted LMA Size: 4.0 and 3.0 Number of attempts: 1 Dental Injury: Teeth and Oropharynx as per pre-operative assessment

## 2017-02-20 NOTE — Anesthesia Procedure Notes (Incomplete)
Procedures

## 2017-02-20 NOTE — Transfer of Care (Signed)
Immediate Anesthesia Transfer of Care Note  Patient: Joanne Robertson  Procedure(s) Performed: Procedure(s): DILATATION & CURETTAGE/HYSTEROSCOPY WITH MYOSURE (N/A) HYSTEROSCOPY WITH NOVASURE (N/A)  Patient Location: PACU  Anesthesia Type:General  Level of Consciousness: sedated  Airway & Oxygen Therapy: Patient Spontanous Breathing and Patient connected to nasal cannula oxygen  Post-op Assessment: Report given to RN  Post vital signs: Reviewed and stable  Last Vitals:  Vitals:   02/20/17 0649  BP: 117/87  Resp: 18  Temp: 36.6 C  SpO2: 100%    Last Pain:  Vitals:   02/20/17 0649  TempSrc: Oral      Patients Stated Pain Goal: 4 (02/20/17 0649)  Complications: No apparent anesthesia complications

## 2017-02-21 ENCOUNTER — Encounter (HOSPITAL_COMMUNITY): Payer: Self-pay | Admitting: Obstetrics and Gynecology

## 2018-05-05 ENCOUNTER — Ambulatory Visit
Admission: RE | Admit: 2018-05-05 | Discharge: 2018-05-05 | Disposition: A | Discharge status: Home / Self Care | Payer: Managed Care, Other (non HMO) | Source: Ambulatory Visit | Attending: Internal Medicine | Admitting: Internal Medicine

## 2018-05-05 ENCOUNTER — Other Ambulatory Visit: Payer: Self-pay | Admitting: Internal Medicine

## 2018-05-05 DIAGNOSIS — R490 Dysphonia: Secondary | ICD-10-CM

## 2018-08-20 ENCOUNTER — Telehealth: Payer: Self-pay | Admitting: Genetic Counselor

## 2018-08-20 ENCOUNTER — Encounter: Payer: Self-pay | Admitting: Genetic Counselor

## 2018-08-20 NOTE — Telephone Encounter (Signed)
A genetic counseling referral has been scheduled for the pt to see Maylon Cos on 3/16 at 10am. Letter mailed to the pt.

## 2018-09-14 ENCOUNTER — Encounter: Payer: Self-pay | Admitting: Genetic Counselor

## 2018-09-14 ENCOUNTER — Other Ambulatory Visit: Payer: Self-pay | Admitting: Oncology

## 2018-09-14 ENCOUNTER — Inpatient Hospital Stay: Payer: Managed Care, Other (non HMO)

## 2018-09-14 ENCOUNTER — Inpatient Hospital Stay: Payer: Managed Care, Other (non HMO) | Attending: Genetic Counselor | Admitting: Genetic Counselor

## 2018-09-14 ENCOUNTER — Other Ambulatory Visit: Payer: Self-pay

## 2018-09-14 DIAGNOSIS — Z1379 Encounter for other screening for genetic and chromosomal anomalies: Secondary | ICD-10-CM

## 2018-09-14 DIAGNOSIS — Z803 Family history of malignant neoplasm of breast: Secondary | ICD-10-CM | POA: Diagnosis not present

## 2018-09-14 NOTE — Progress Notes (Signed)
REFERRING PROVIDER: Lanice Shirts, MD [address] [place], [place] [address]  PRIMARY PROVIDER:  Schoenhoff, Altamese Cabal, MD  PRIMARY REASON FOR VISIT:  1. Family history of breast cancer      HISTORY OF PRESENT ILLNESS:   Ms. Rudden, a 53 y.o. female, was seen for a Colon cancer genetics consultation at the request of Dr. Coralyn Mark due to a family history of breast cancer.  Ms. Erck presents to clinic today to discuss the possibility of a hereditary predisposition to cancer, genetic testing, and to further clarify her future cancer risks, as well as potential cancer risks for family members.   Ms. Maynor is a 53 y.o. female with no personal history of cancer.  She has had a breast biopsy in the past that was benign.  She has been told that she has dense breast density.  CANCER HISTORY:   No history exists.     RISK FACTORS:  Menarche was at age 49.  First live birth at age 68.  OCP use for approximately 8-9 years.  Ovaries intact: yes.  Hysterectomy: no.  Menopausal status: perimenopausal.  HRT use: 0 years. Colonoscopy: yes; normal. Mammogram within the last year: yes. Number of breast biopsies: 1. Up to date with pelvic exams: yes. Any excessive radiation exposure in the past: no  Past Medical History:  Diagnosis Date   Allergy    SEASONAL   Anemia    Arthritis    Clostridium difficile infection 2004   Esophageal stricture 03/2009   Family history of breast cancer    GERD (gastroesophageal reflux disease)    Giardia    IBS (irritable bowel syndrome)    Lymphocytic colitis     Past Surgical History:  Procedure Laterality Date   CESAREAN SECTION     DILATATION & CURETTAGE/HYSTEROSCOPY WITH MYOSURE N/A 02/20/2017   Procedure: [place];  Surgeon: Brien Few, MD;  Location: [facility];  Service: Gynecology;  Laterality: N/A;   HYSTEROSCOPY WITH NOVASURE N/A 02/20/2017   Procedure:  HYSTEROSCOPY WITH NOVASURE;  Surgeon: Brien Few, MD;  Location: [facility];  Service: Gynecology;  Laterality: N/A;    Social History   Socioeconomic History   Marital status: Married    Spouse name: Not on file   Number of children: Not on file   Years of education: Not on file   Highest education level: Not on file  Occupational History   Not on file  Social Needs   Financial resource strain: Not on file   Food insecurity:    Worry: Not on file    Inability: Not on file   Transportation needs:    Medical: Not on file    Non-medical: Not on file  Tobacco Use   Smoking status: Former Smoker    Years: 10.00    Types: Cigarettes   Smokeless tobacco: Never Used  Substance and Sexual Activity   Alcohol use: Yes    Comment: 4 glasses per week   Drug use: No   Sexual activity: Never    Birth control/protection: Surgical  Lifestyle   Physical activity:    Days per week: Not on file    Minutes per session: Not on file   Stress: Not on file  Relationships   Social connections:    Talks on phone: Not on file    Gets together: Not on file    Attends religious service: Not on file    Active member of club or  organization: Not on file    Attends meetings of clubs or organizations: Not on file    Relationship status: Not on file  Other Topics Concern   Not on file  Social History Narrative   Not on file     FAMILY HISTORY:  We obtained a detailed, 4-generation family history.  Significant diagnoses are listed below: Family History  Problem Relation Age of Onset   Breast cancer Maternal Aunt        dx early 58s   Breast cancer Maternal Grandmother 60       d. 82   Diabetes Father    Cancer Paternal Uncle        unknown type   Multiple sclerosis Maternal Grandfather    Breast cancer Paternal Grandmother        dx late 79s; d. 77   Heart attack Paternal Grandfather    Breast cancer Maternal Aunt        dx late 50s-early 60s   Cancer  Paternal Aunt        unknown type    The patient has one son and an adopted daughter who are cancer free.  She has two brothers who are cancer free. Both parents are living.  The patient's mother has two sisters who both had breast cancer in their late 14's-early 60's.  The maternal grandparents are deceased. The grandmother had breast cancer and the grandfather died from complications of MS.  The patient's father is living and cancer free.  He had two sisters and a brother.  The brother and one sister may have had some form of cancer.  The paternal grandparents are deceased.  The grandmother had breast cancer.  Ms. Creegan is unaware of previous family history of genetic testing for hereditary cancer risks. Patient's maternal ancestors are of [place] descent, and paternal ancestors are of [place] and [place] descent. There is no reported Ashkenazi Jewish ancestry. There is no known consanguinity.  GENETIC COUNSELING ASSESSMENT: Ms. Griffy is a 53 y.o. female with a family history of cancer which is somewhat suggestive of a hereditary breast cancer syndrome and predisposition to cancer. We, therefore, discussed and recommended the following at today's visit.   DISCUSSION: We discussed that 5 - 10% of breast cancer is hereditary, with most cases associated with BRCA mutations. We discussed that based on her family history of later onset breast cancer, the likelihood of there being a BRCA mutation is low, and that this is more likely a familial form of breast cancer.  There are other genes that can be associated with hereditary breast cancer syndromes.  These include ATM, CHEK2 and PALB2.    We reviewed the characteristics, features and inheritance patterns of hereditary cancer syndromes. We also discussed genetic testing, including the appropriate family members to test, the process of testing, insurance coverage and turn-around-time for results. We discussed the implications of a negative,  positive and/or variant of uncertain significant result. We recommended Ms. Deshaies pursue genetic testing for the CancerNext-Expanded gene panel. The CancerNext-Expanded gene panel offered by [facility] and includes sequencing and rearrangement analysis for the following 67 genes: AIP, ALK, APC*, ATM*, BAP1, BARD1, BLM, BMPR1A, BRCA1*, BRCA2*, BRIP1*, CDH1*, CDK4, CDKN1B, CDKN2A, CHEK2*, DICER1, FANCC, FH, FLCN, GALNT12, HOXB13, MAX, MEN1, MET, MLH1*, MRE11A, MSH2*, MSH6*, MUTYH*, NBN, NF1*, NF2, PALB2*, PHOX2B, PMS2*, POLD1, POLE, POT1, PRKAR1A, PTCH1, PTEN*, RAD50, RAD51C*, RAD51D*, RB1, RET, SDHA, SDHAF2, SDHB, SDHC, SDHD, SMAD4, SMARCA4, SMARCB1, SMARCE1, STK11, SUFU, TMEM127, TP53*, TSC1, TSC2,  VHL and XRCC2 (sequencing and deletion/duplication); MITF (sequencing only); EPCAM and GREM1 (deletion/duplication only). DNA and RNA analyses performed for * genes.    Based on Ms. Badman's family history of cancer, she meets medical criteria for genetic testing. Despite that she meets criteria, she may still have an out of pocket cost. We discussed that if her out of pocket cost for testing is over $100, the laboratory will call and confirm whether she wants to proceed with testing.  If the out of pocket cost of testing is less than $100 she will be billed by the genetic testing laboratory.   Based on the patient's family history, a statistical model (Tyrer Cusik) was used to estimate her risk of developing breast cancer. This estimates her lifetime risk of developing breast cancer to be approximately 28.2%. This estimation does not consider any genetic testing results.  The patient's lifetime breast cancer risk is a preliminary estimate based on available information using one of several models endorsed by the [place] (ACS). The ACS recommends consideration of breast MRI screening as an adjunct to mammography for patients at high risk (defined as 20% or greater lifetime risk).   Ms. Vicars has  been determined to be at high risk for breast cancer.  Therefore, we recommend that annual screening with mammography and breast MRI be performed. We discussed that Ms. Tolley should discuss her individual situation with her referring physician and determine a breast cancer screening plan with which they are both comfortable.  We will refer her to the [facility] high risk breast clinic.  PLAN: After considering the risks, benefits, and limitations, Ms. Bright provided informed consent to pursue genetic testing and the blood sample was sent to [facility] for analysis of the CancerNext-Expanded. Results should be available within approximately 2-3 weeks' time, at which point they will be disclosed by telephone to Ms. Plake, as will any additional recommendations warranted by these results. Ms. Lineback will receive a summary of her genetic counseling visit and a copy of her results once available. This information will also be available in Epic.   Lastly, we encouraged Ms. Creppel to remain in contact with cancer genetics annually so that we can continuously update the family history and inform her of any changes in cancer genetics and testing that may be of benefit for this family.   Ms. Horlacher questions were answered to her satisfaction today. Our contact information was provided should additional questions or concerns arise. Thank you for the referral and allowing [place] to share in the care of your patient.   Agastya Meister P. Florene Glen, [place], [facility] Certified Genetic Counselor [email or web address] phone: [phone]  The patient was seen for a total of 45 minutes in face-to-face genetic counseling.  This patient was discussed with Drs. Magrinat, Lindi Adie and/or Burr Medico who agrees with the above.    _______________________________________________________________________ For Office Staff:  Number of people involved in session: 1 Was an Intern/ student involved with case: no

## 2018-10-02 ENCOUNTER — Encounter: Payer: Self-pay | Admitting: Genetic Counselor

## 2018-10-02 ENCOUNTER — Ambulatory Visit: Payer: Self-pay | Admitting: Genetic Counselor

## 2018-10-02 ENCOUNTER — Telehealth: Payer: Self-pay | Admitting: Genetic Counselor

## 2018-10-02 DIAGNOSIS — Z1379 Encounter for other screening for genetic and chromosomal anomalies: Secondary | ICD-10-CM | POA: Insufficient documentation

## 2018-10-02 NOTE — Progress Notes (Addendum)
HPI:  Ms. Baumbach was previously seen in the [facility] clinic due to a family history of cancer and concerns regarding a hereditary predisposition to cancer. Please refer to our prior cancer genetics clinic note for more information regarding our discussion, assessment and recommendations, at the time. Ms. Wiemann recent genetic test results were disclosed to her, as were recommendations warranted by these results. These results and recommendations are discussed in more detail below.  CANCER HISTORY:   No history exists.    FAMILY HISTORY:  We obtained a detailed, 4-generation family history.  Significant diagnoses are listed below: Family History  Problem Relation Age of Onset   Breast cancer Maternal Aunt        dx early 9s   Breast cancer Maternal Grandmother 60       d. 70   Diabetes Father    Cancer Paternal Uncle        unknown type   Multiple sclerosis Maternal Grandfather    Breast cancer Paternal Grandmother        dx late 86s; d. 22   Heart attack Paternal Grandfather    Breast cancer Maternal Aunt        dx late 50s-early 60s   Cancer Paternal Aunt        unknown type    The patient has one son and an adopted daughter who are cancer free.  She has two brothers who are cancer free. Both parents are living.  The patient's mother has two sisters who both had breast cancer in their late 75's-early 60's.  The maternal grandparents are deceased. The grandmother had breast cancer and the grandfather died from complications of MS.  The patient's father is living and cancer free.  He had two sisters and a brother.  The brother and one sister may have had some form of cancer.  The paternal grandparents are deceased.  The grandmother had breast cancer.  Ms. Kachel is unaware of previous family history of genetic testing for hereditary cancer risks. Patient's maternal ancestors are of [place] descent, and paternal ancestors are of [place] and  [place] descent. There is no reported Ashkenazi Jewish ancestry. There is no known consanguinity.   GENETIC TEST RESULTS: Genetic testing reported out on September 22, 2018 through the CancerNext-Expanded +RNAinisght cancer panel found no pathogenic mutations. The CancerNext-Expanded gene panel offered by [facility] and includes sequencing and rearrangement analysis for the following 67 genes: AIP, ALK, APC*, ATM*, BAP1, BARD1, BLM, BMPR1A, BRCA1*, BRCA2*, BRIP1*, CDH1*, CDK4, CDKN1B, CDKN2A, CHEK2*, DICER1, FANCC, FH, FLCN, GALNT12, HOXB13, MAX, MEN1, MET, MLH1*, MRE11A, MSH2*, MSH6*, MUTYH*, NBN, NF1*, NF2, PALB2*, PHOX2B, PMS2*, POLD1, POLE, POT1, PRKAR1A, PTCH1, PTEN*, RAD50, RAD51C*, RAD51D*, RB1, RET, SDHA, SDHAF2, SDHB, SDHC, SDHD, SMAD4, SMARCA4, SMARCB1, SMARCE1, STK11, SUFU, TMEM127, TP53*, TSC1, TSC2, VHL and XRCC2 (sequencing and deletion/duplication); MITF (sequencing only); EPCAM and GREM1 (deletion/duplication only). DNA and RNA analyses performed for * genes. The test report has been scanned into EPIC and is located under the Molecular Pathology section of the Results Review tab.  A portion of the result report is included below for reference.     We discussed with Ms. Leiber that because current genetic testing is not perfect, it is possible there may be a gene mutation in one of these genes that current testing cannot detect, but that chance is small.  We also discussed, that there could be another gene that has not yet been discovered, or that we have not  yet tested, that is responsible for the cancer diagnoses in the family. It is also possible there is a hereditary cause for the cancer in the family that Ms. Stankiewicz did not inherit and therefore was not identified in her testing.  Therefore, it is important to remain in touch with cancer genetics in the future so that we can continue to offer Ms. Clum the most up to date genetic testing.   Genetic testing did identify a variant of  uncertain significance (VUS) was identified in the POLE gene called [id number].  At this time, it is unknown if this variant is associated with increased cancer risk or if this is a normal finding, but most variants such as this get reclassified to being inconsequential. It should not be used to make medical management decisions. With time, we suspect the lab will determine the significance of this variant, if any. If we do learn more about it, we will try to contact Ms. Gutierrez to discuss it further. However, it is important to stay in touch with [place] periodically and keep the address and phone number up to date.  ADDITIONAL GENETIC TESTING: We discussed with Ms. Stern that her genetic testing was fairly extensive.  If there are genes identified to increase cancer risk that can be analyzed in the future, we would be happy to discuss and coordinate this testing at that time.    CANCER SCREENING RECOMMENDATIONS: Ms. Brahmbhatt test result is considered negative (normal).  This means that we have not identified a hereditary cause for her family history of cancer at this time. Most cancers happen by chance and this negative test suggests that her cancer may fall into this category.    While reassuring, this does not definitively rule out a hereditary predisposition to cancer. It is still possible that there could be genetic mutations that are undetectable by current technology. There could be genetic mutations in genes that have not been tested or identified to increase cancer risk.  Therefore, it is recommended she continue to follow the cancer management and screening guidelines provided by her primary healthcare provider.   An individual's cancer risk and medical management are not determined by genetic test results alone. Overall cancer risk assessment incorporates additional factors, including personal medical history, family history, and any available genetic information that may result in a personalized plan for  cancer prevention and surveillance  RECOMMENDATIONS FOR FAMILY MEMBERS:  Individuals in this family might be at some increased risk of developing cancer, over the general population risk, simply due to the family history of cancer.  We recommended women in this family have a yearly mammogram beginning at age 38, or 53 years younger than the earliest onset of cancer, an annual clinical breast exam, and perform monthly breast self-exams. Women in this family should also have a gynecological exam as recommended by their primary provider. All family members should have a colonoscopy by age 49.  FOLLOW-UP: Lastly, we discussed with Ms. Govea that cancer genetics is a rapidly advancing field and it is possible that new genetic tests will be appropriate for her and/or her family members in the future. We encouraged her to remain in contact with cancer genetics on an annual basis so we can update her personal and family histories and let her know of advances in cancer genetics that may benefit this family.   Our contact number was provided. Ms. Boice questions were answered to her satisfaction, and she knows she is welcome to call us at anytime  with additional questions or concerns.   Roma Kayser, MS, [facility] Certified Genetic Counselor [email or web address]

## 2018-10-02 NOTE — Telephone Encounter (Signed)
Revealed negative genetic testing.  Discussed that we do not know why there is cancer in the family. It could be due to a different gene that we are not testing, or maybe our current technology may not be able to pick something up.  It will be important for her to keep in contact with genetics to keep up with whether additional testing may be needed.  

## 2019-07-21 ENCOUNTER — Encounter: Payer: Self-pay | Admitting: Cardiovascular Disease

## 2019-07-21 ENCOUNTER — Ambulatory Visit (INDEPENDENT_AMBULATORY_CARE_PROVIDER_SITE_OTHER): Payer: Managed Care, Other (non HMO) | Admitting: Cardiovascular Disease

## 2019-07-21 ENCOUNTER — Other Ambulatory Visit: Payer: Self-pay

## 2019-07-21 DIAGNOSIS — R0789 Other chest pain: Secondary | ICD-10-CM

## 2019-07-21 DIAGNOSIS — E782 Mixed hyperlipidemia: Secondary | ICD-10-CM | POA: Diagnosis not present

## 2019-07-21 DIAGNOSIS — E785 Hyperlipidemia, unspecified: Secondary | ICD-10-CM | POA: Insufficient documentation

## 2019-07-21 DIAGNOSIS — R072 Precordial pain: Secondary | ICD-10-CM

## 2019-07-21 DIAGNOSIS — R011 Cardiac murmur, unspecified: Secondary | ICD-10-CM | POA: Diagnosis not present

## 2019-07-21 NOTE — Patient Instructions (Addendum)
Medication Instructions:  Your physician recommends that you continue on your current medications as directed. Please refer to the Current Medication list given to you today.  If you need a refill on your cardiac medications before your next appointment, please call your pharmacy.   Lab work: NONE  Testing/Procedures: Your physician has requested that you have an echocardiogram. Echocardiography is a painless test that uses sound waves to create images of your heart. It provides your doctor with information about the size and shape of your heart and how well your heart's chambers and valves are working. This procedure takes approximately one hour. There are no restrictions for this procedure. [address]  AND  Coronary Calcium Score  Follow-Up: At [facility], you and your health needs are our priority.  As part of our continuing mission to provide you with exceptional heart care, we have created designated Provider Care Teams.  These Care Teams include your primary Cardiologist (physician) and Advanced Practice Providers (APPs -  Physician Assistants and Nurse Practitioners) who all work together to provide you with the care you need, when you need it. You may see Dr. Allyson Sabal or one of the following Advanced Practice Providers on your designated Care Team:    Corine Shelter, PA-C  [place], [place]  Edd Fabian, [place]  Your physician wants you to follow-up as needed    Coronary Calcium Scan A coronary calcium scan is an imaging test used to look for deposits of plaque in the inner lining of the blood vessels of the heart (coronary arteries). Plaque is made up of calcium, protein, and fatty substances. These deposits of plaque can partly clog and narrow the coronary arteries without producing any symptoms or warning signs. This puts a person at risk for a heart attack. This test is recommended for people who are at moderate risk for heart disease. The test can  find plaque deposits before symptoms develop. Tell a health care provider about:  Any allergies you have.  All medicines you are taking, including vitamins, herbs, eye drops, creams, and over-the-counter medicines.  Any problems you or family members have had with anesthetic medicines.  Any blood disorders you have.  Any surgeries you have had.  Any medical conditions you have.  Whether you are pregnant or may be pregnant. What are the risks? Generally, this is a safe procedure. However, problems may occur, including:  Harm to a pregnant woman and her unborn baby. This test involves the use of radiation. Radiation exposure can be dangerous to a pregnant woman and her unborn baby. If you are pregnant or think you may be pregnant, you should not have this procedure done.  Slight increase in the risk of cancer. This is because of the radiation involved in the test. What happens before the procedure? Ask your health care provider for any specific instructions on how to prepare for this procedure. You may be asked to avoid products that contain caffeine, tobacco, or nicotine for 4 hours before the procedure. What happens during the procedure?   You will undress and remove any jewelry from your neck or chest.  You will put on a hospital gown.  Sticky electrodes will be placed on your chest. The electrodes will be connected to an electrocardiogram (ECG) machine to record a tracing of the electrical activity of your heart.  You will lie down on a curved bed that is attached to the CT scanner.  You may be given medicine to slow down  your heart rate so that clear pictures can be created.  You will be moved into the CT scanner, and the CT scanner will take pictures of your heart. During this time, you will be asked to lie still and hold your breath for 2-3 seconds at a time while each picture of your heart is being taken. The procedure may vary among health care providers and  hospitals. What happens after the procedure?  You can get dressed.  You can return to your normal activities.  It is up to you to get the results of your procedure. Ask your health care provider, or the department that is doing the procedure, when your results will be ready. Summary  A coronary calcium scan is an imaging test used to look for deposits of plaque in the inner lining of the blood vessels of the heart (coronary arteries). Plaque is made up of calcium, protein, and fatty substances.  Generally, this is a safe procedure. Tell your health care provider if you are pregnant or may be pregnant.  Ask your health care provider for any specific instructions on how to prepare for this procedure.  A CT scanner will take pictures of your heart.  You can return to your normal activities after the scan is done. This information is not intended to replace advice given to you by your health care provider. Make sure you discuss any questions you have with your health care provider. Document Revised: 01/05/2019 Document Reviewed: 01/05/2019 Elsevier Patient Education  [place].

## 2019-07-21 NOTE — Assessment & Plan Note (Signed)
History of hyperlipidemia recently started on his statin drug by her PCP for lipid profile performed 06/11/2019 revealed total cholesterol 221, LDL 123 and HDL of 86.

## 2019-07-21 NOTE — Assessment & Plan Note (Signed)
Late systolic click on exam today.  We will check a 2D echo to rule out mitral prolapse.

## 2019-07-21 NOTE — Assessment & Plan Note (Signed)
Several month history of atypical chest pain that occur several times a week.  Characterized as a pressure sensation with occasional radiation to her right shoulder.  It can last for hours at a time.  She also has reflux type symptoms and history of esophageal stricture.  I am going to get a coronary calcium score to further evaluate

## 2019-07-21 NOTE — Progress Notes (Signed)
07/21/2019 SHAKILA MAK   07-Jun-1966  [id number]  Primary Physician Lorenda Ishihara, MD Primary Cardiologist: Runell Gess MD Nicholes Calamity, [place]  HPI:  Joanne Robertson is a 54 y.o. thin appearing married Caucasian female mother of 2, grandmother 1 grandchild referred by Dr.Varadarajan for evaluation of atypical chest pain.  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  Her only cardiac risk factor is hyperlipidemia recently started on a statin drug.  She does have reflux on omeprazole and history of esophageal stricture.  There is no family history of heart disease.  She developed chest pressure several months ago that occur several times a week lasting minutes to hours at a time, not brought on by any activities.   Current Meds  Medication Sig  . FLUoxetine (PROZAC) 20 MG tablet Take 20 mg by mouth daily before breakfast.  . ibuprofen (ADVIL,MOTRIN) 200 MG tablet Take 400 mg by mouth every 8 (eight) hours as needed (FOR PAIN/HEADACHES.).  . Iron-FA-B Cmp-C-Biot-Probiotic (FUSION PLUS) CAPS Take 1 capsule by mouth at bedtime.  Marland Kitchen omeprazole (PRILOSEC) 20 MG capsule Take 20 mg by mouth daily.  . simvastatin (ZOCOR) 20 MG tablet Take 20 mg by mouth at bedtime.     Allergies  Allergen Reactions  . Food Other (See Comments)    Mushroom--nausea/vomiting.  . Promethazine Hcl Other (See Comments)    Agitation   . Penicillins Itching and Rash    Has patient had a PCN reaction causing immediate rash, facial/tongue/throat swelling, SOB or lightheadedness with hypotension: No Has patient had a PCN reaction causing severe rash involving mucus membranes or skin necrosis: Unknown Has patient had a PCN reaction that required hospitalization: No Has patient had a PCN reaction occurring within the last 10 years: No If all of the above answers are "NO", then may proceed with Cephalosporin use.   . Sulfonamide Derivatives  Itching and Rash    REACTION: rash, itching    Social History   Socioeconomic History  . Marital status: Married    Spouse name: Not on file  . Number of children: Not on file  . Years of education: Not on file  . Highest education level: Not on file  Occupational History  . Not on file  Tobacco Use  . Smoking status: Former Smoker    Years: 10.00    Types: Cigarettes  . Smokeless tobacco: Never Used  Substance and Sexual Activity  . Alcohol use: Yes    Comment: 4 glasses per week  . Drug use: No  . Sexual activity: Never    Birth control/protection: Surgical  Other Topics Concern  . Not on file  Social History Narrative  . Not on file   Social Determinants of Health   Financial Resource Strain:   . Difficulty of Paying Living Expenses: Not on file  Food Insecurity:   . Worried About Programme researcher, broadcasting/film/video in the Last Year: Not on file  . Ran Out of Food in the Last Year: Not on file  Transportation Needs:   . Lack of Transportation (Medical): Not on file  . Lack of Transportation (Non-Medical): Not on file  Physical Activity:   . Days of Exercise per Week: Not on file  . Minutes of Exercise per Session: Not on file  Stress:   . Feeling of Stress : Not on file  Social Connections:   . Frequency of Communication with Friends and  Family: Not on file  . Frequency of Social Gatherings with Friends and Family: Not on file  . Attends Religious Services: Not on file  . Active Member of Clubs or Organizations: Not on file  . Attends Archivist Meetings: Not on file  . Marital Status: Not on file  Intimate Partner Violence:   . Fear of Current or Ex-Partner: Not on file  . Emotionally Abused: Not on file  . Physically Abused: Not on file  . Sexually Abused: Not on file     Review of Systems: General: negative for chills, fever, night sweats or weight changes.  Cardiovascular: negative for chest pain, dyspnea on exertion, edema, orthopnea, palpitations,  paroxysmal nocturnal dyspnea or shortness of breath Dermatological: negative for rash Respiratory: negative for cough or wheezing Urologic: negative for hematuria Abdominal: negative for nausea, vomiting, diarrhea, bright red blood per rectum, melena, or hematemesis Neurologic: negative for visual changes, syncope, or dizziness All other systems reviewed and are otherwise negative except as noted above.    Blood pressure 124/72, pulse 85, height 5' (1.524 m), weight 116 lb (52.6 kg), SpO2 99 %.  General appearance: alert and no distress Neck: no adenopathy, no JVD, supple, symmetrical, trachea midline and thyroid not enlarged, symmetric, no tenderness/mass/nodules Lungs: clear to auscultation bilaterally Heart: regular rate and rhythm, S1, S2 normal, no murmur, click, rub or gallop Extremities: extremities normal, atraumatic, no cyanosis or edema Pulses: 2+ and symmetric Skin: Skin color, texture, turgor normal. No rashes or lesions Neurologic: Alert and oriented X 3, normal strength and tone. Normal symmetric reflexes. Normal coordination and gait  EKG sinus rhythm at 85 without ST or T wave changes.  I personally reviewed this EKG.  ASSESSMENT AND PLAN:   Hyperlipidemia History of hyperlipidemia recently started on his statin drug by her PCP for lipid profile performed 06/11/2019 revealed total cholesterol 221, LDL 123 and HDL of 86.  Atypical chest pain Several month history of atypical chest pain that occur several times a week.  Characterized as a pressure sensation with occasional radiation to her right shoulder.  It can last for hours at a time.  She also has reflux type symptoms and history of esophageal stricture.  I am going to get a coronary calcium score to further evaluate  Systolic click Late systolic click on exam today.  We will check a 2D echo to rule out mitral prolapse.      Lorretta Harp MD FACP,FACC,FAHA, [facility] 07/21/2019 10:15 AM

## 2019-08-03 ENCOUNTER — Other Ambulatory Visit: Payer: Self-pay

## 2019-08-03 ENCOUNTER — Ambulatory Visit (HOSPITAL_COMMUNITY): Payer: Managed Care, Other (non HMO) | Attending: Internal Medicine

## 2019-08-03 ENCOUNTER — Ambulatory Visit (INDEPENDENT_AMBULATORY_CARE_PROVIDER_SITE_OTHER)
Admission: RE | Admit: 2019-08-03 | Discharge: 2019-08-03 | Disposition: A | Discharge status: Home / Self Care | Payer: Managed Care, Other (non HMO) | Source: Ambulatory Visit | Attending: Cardiovascular Disease | Admitting: Cardiovascular Disease

## 2019-08-03 DIAGNOSIS — R072 Precordial pain: Secondary | ICD-10-CM | POA: Diagnosis present

## 2019-08-03 DIAGNOSIS — R0789 Other chest pain: Secondary | ICD-10-CM | POA: Insufficient documentation

## 2019-08-03 DIAGNOSIS — E782 Mixed hyperlipidemia: Secondary | ICD-10-CM | POA: Diagnosis present

## 2019-08-03 DIAGNOSIS — R011 Cardiac murmur, unspecified: Secondary | ICD-10-CM | POA: Insufficient documentation

## 2019-08-18 ENCOUNTER — Telehealth: Payer: Self-pay | Admitting: Cardiovascular Disease

## 2019-08-18 NOTE — Telephone Encounter (Signed)
Patient states she has not talked to anyone regardinh echo and lab results.  Please call patient to discuss

## 2020-02-29 ENCOUNTER — Encounter: Payer: Self-pay | Admitting: Genetic Counselor

## 2021-10-17 ENCOUNTER — Ambulatory Visit
Admission: RE | Admit: 2021-10-17 | Discharge: 2021-10-17 | Disposition: A | Discharge status: Home / Self Care | Payer: Managed Care, Other (non HMO) | Source: Ambulatory Visit | Attending: Internal Medicine | Admitting: Internal Medicine

## 2021-10-17 ENCOUNTER — Other Ambulatory Visit: Payer: Self-pay | Admitting: Internal Medicine

## 2021-10-17 DIAGNOSIS — M5442 Lumbago with sciatica, left side: Secondary | ICD-10-CM

## 2024-02-24 ENCOUNTER — Encounter: Payer: Self-pay | Admitting: Pediatrics

## 2024-03-02 ENCOUNTER — Encounter: Payer: Self-pay | Admitting: Oncology

## 2024-03-11 ENCOUNTER — Ambulatory Visit (AMBULATORY_SURGERY_CENTER)

## 2024-03-11 VITALS — Ht 60.0 in | Wt 114.0 lb

## 2024-03-11 DIAGNOSIS — Z1211 Encounter for screening for malignant neoplasm of colon: Secondary | ICD-10-CM

## 2024-03-11 MED ORDER — NA SULFATE-K SULFATE-MG SULF 17.5-3.13-1.6 GM/177ML PO SOLN: 1.0000 | Freq: Once | ORAL | 0 refills | Status: AC

## 2024-03-11 NOTE — Progress Notes (Signed)
 Pre visit completed via video call; Patient verified name, DOB, and address; No egg or soy allergy known to patient; No issues known to pt with past sedation with any surgeries or procedures; Patient denies ever being told they had issues or difficulty with intubation;  No FH of Malignant Hyperthermia; Pt is not on diet pills; Pt is not on home 02;  Pt is not on blood thinners;  Pt denies issues with constipation  No A fib or A flutter; Have any cardiac testing pending--NO Insurance verified during PV appt--- [employer] Pt can ambulate without assistance;  Pt denies use of chewing tobacco; Discussed diabetic/weight loss medication holds; Discussed NSAID holds; Checked BMI to be less than 50; Pt instructed to use Single[employer] or GoodRx for a price reduction on prep;  Patient's chart reviewed by Norleen Schillings CNRA prior to previsit and patient appropriate for the [facility]; Pre visit completed and red dot placed by patient's name on their procedure day (on provider's schedule); Instructions sent to MyChart per patient request;

## 2024-03-15 ENCOUNTER — Encounter: Payer: Self-pay | Admitting: Pediatrics

## 2024-03-24 NOTE — Progress Notes (Unsigned)
 [facility] History and Physical   Primary Care Physician:  Elliot Charm, MD   Reason for Procedure:   Colorectal cancer screening  Plan:    Screening colonoscopy     HPI: Joanne Robertson is a 58 y.o. female undergoing screening colonoscopy for colorectal cancer screening.  Last colonoscopy performed in 2015 was normal without any evidence of polyps.  Past medical history includes diagnoses of GERD, IBS and lymphocytic colitis.  No current active GI symptoms are reported.  No documented family history of colorectal cancer or polyps.   Past Medical History:  Diagnosis Date   Allergy    SEASONAL   Anemia    Arthritis    Clostridium difficile infection 2004   Esophageal stricture 03/2009   Family history of breast cancer    GERD (gastroesophageal reflux disease)    Giardia    IBS (irritable bowel syndrome)    Lymphocytic colitis     Past Surgical History:  Procedure Laterality Date   CESAREAN SECTION     COLONOSCOPY  2015   MS-MAC-moviprep(exc)-int hems/normal-10 yr recall   DILATATION & CURETTAGE/HYSTEROSCOPY WITH MYOSURE N/A 02/20/2017   Procedure: DILATATION & CURETTAGE/HYSTEROSCOPY WITH MYOSURE;  Surgeon: Gorge Ade, MD;  Location: [facility];  Service: Gynecology;  Laterality: N/A;   HYSTEROSCOPY WITH NOVASURE N/A 02/20/2017   Procedure: HYSTEROSCOPY WITH NOVASURE;  Surgeon: Gorge Ade, MD;  Location: [facility];  Service: Gynecology;  Laterality: N/A;    Prior to Admission medications   Medication Sig Start Date End Date Taking? Authorizing Provider  Acetylcysteine (NAC PO) Take 1,000 mg by mouth daily.    [provider]  atorvastatin (LIPITOR) 10 MG tablet Take 10 mg by mouth daily. 02/20/24   [provider]  DULoxetine (CYMBALTA) 60 MG capsule Take 60 mg by mouth daily. 03/03/24   [provider]  ibuprofen (ADVIL,MOTRIN) 200 MG tablet Take 400 mg by mouth every 8 (eight) hours as needed (FOR PAIN/HEADACHES.).     [provider]  metoprolol succinate (TOPROL-XL) 50 MG 24 hr tablet Take 50 mg by mouth daily. 03/05/24   [provider]  omeprazole  (PRILOSEC) 20 MG capsule Take 20 mg by mouth daily.    [provider]  valACYclovir (VALTREX) 1000 MG tablet Take 1,000 mg by mouth 2 (two) times daily as needed. 09/22/23   [provider]    Current Outpatient Medications  Medication Sig Dispense Refill   Acetylcysteine (NAC PO) Take 1,000 mg by mouth daily.     atorvastatin (LIPITOR) 10 MG tablet Take 10 mg by mouth daily.     DULoxetine (CYMBALTA) 60 MG capsule Take 60 mg by mouth daily.     metoprolol succinate (TOPROL-XL) 50 MG 24 hr tablet Take 50 mg by mouth daily.     nystatin-triamcinolone ointment (MYCOLOG) SMARTSIG:1 Topical Daily     omeprazole  (PRILOSEC) 20 MG capsule Take 20 mg by mouth daily.     ibuprofen (ADVIL,MOTRIN) 200 MG tablet Take 400 mg by mouth every 8 (eight) hours as needed (FOR PAIN/HEADACHES.).     valACYclovir (VALTREX) 1000 MG tablet Take 1,000 mg by mouth 2 (two) times daily as needed.     Current Facility-Administered Medications  Medication Dose Route Frequency Provider Last Rate Last Admin   0.9 %  sodium chloride  infusion  500 mL Intravenous Once Talitha Dicarlo M, MD        Allergies as of 03/25/2024 - Review Complete 03/25/2024  Allergen Reaction Noted   Penicillins Itching, Rash, and  Dermatitis 07/19/2008   Sulfonamide derivatives Dermatitis 11/08/2013   Food Nausea And Vomiting and Other (See Comments) 02/18/2017   Penicillamine Rash 03/11/2024   Promethazine hcl Other (See Comments) 03/11/2024   Promethazine hcl Rash and Other (See Comments) 07/19/2008    Family History  Problem Relation Age of Onset   Diabetes Father    Breast cancer Maternal Aunt        dx early 82s   Breast cancer Maternal Aunt        dx late 50s-early 60s   Cancer Paternal Aunt        unknown type   Cancer Paternal Uncle        unknown type    Breast cancer Maternal Grandmother 60       d. 52   Multiple sclerosis Maternal Grandfather    Breast cancer Paternal Grandmother        dx late 109s; d. 71   Heart attack Paternal Grandfather    Colon polyps Neg Hx    Colon cancer Neg Hx    Rectal cancer Neg Hx    Stomach cancer Neg Hx     Social History   Socioeconomic History   Marital status: Married    Spouse name: Not on file   Number of children: Not on file   Years of education: Not on file   Highest education level: Not on file  Occupational History   Not on file  Tobacco Use   Smoking status: Former    Types: Cigarettes   Smokeless tobacco: Never  Vaping Use   Vaping status: Never Used  Substance and Sexual Activity   Alcohol use: Yes    Alcohol/week: 14.0 standard drinks of alcohol    Types: 14 Standard drinks or equivalent per week   Drug use: No   Sexual activity: Never    Birth control/protection: Surgical  Other Topics Concern   Not on file  Social History Narrative   Not on file   Social Drivers of Health   Financial Resource Strain: Not on file  Food Insecurity: Not on file  Transportation Needs: Not on file  Physical Activity: Not on file  Stress: Not on file  Social Connections: Not on file  Intimate Partner Violence: Not on file    Review of Systems:  All other review of systems negative except as mentioned in the HPI.  Physical Exam: Vital signs BP (!) 157/92   Pulse 70   Temp (!) 97.4 F (36.3 C)   Resp 12   Ht 5' (1.524 m)   Wt 114 lb (51.7 kg)   SpO2 100%   BMI 22.26 kg/m   General:   Alert,  Well-developed, well-nourished, pleasant and cooperative in NAD Airway:  Mallampati 1 Lungs:  Clear throughout to auscultation.   Heart:  Regular rate and rhythm; no murmurs, clicks, rubs,  or gallops. Abdomen:  Soft, nontender and nondistended. Normal bowel sounds.   Neuro/Psych:  Normal mood and affect. A and O x 3  Inocente Hausen, MD [facility]

## 2024-03-25 ENCOUNTER — Encounter: Payer: Self-pay | Admitting: Pediatrics

## 2024-03-25 ENCOUNTER — Ambulatory Visit (AMBULATORY_SURGERY_CENTER): Admitting: Pediatrics

## 2024-03-25 VITALS — BP 130/80 | HR 70 | Temp 97.4°F | Resp 18 | Ht 60.0 in | Wt 114.0 lb

## 2024-03-25 DIAGNOSIS — K644 Residual hemorrhoidal skin tags: Secondary | ICD-10-CM

## 2024-03-25 DIAGNOSIS — K635 Polyp of colon: Secondary | ICD-10-CM | POA: Diagnosis not present

## 2024-03-25 DIAGNOSIS — D122 Benign neoplasm of ascending colon: Secondary | ICD-10-CM | POA: Diagnosis not present

## 2024-03-25 DIAGNOSIS — K648 Other hemorrhoids: Secondary | ICD-10-CM | POA: Diagnosis not present

## 2024-03-25 DIAGNOSIS — Z1211 Encounter for screening for malignant neoplasm of colon: Secondary | ICD-10-CM | POA: Diagnosis present

## 2024-03-25 MED ORDER — SODIUM CHLORIDE 0.9 % IV SOLN: 500.0000 mL | Freq: Once | INTRAVENOUS | Status: DC

## 2024-03-25 NOTE — Progress Notes (Signed)
 Report given to PACU, vss

## 2024-03-25 NOTE — Patient Instructions (Signed)
 Resume previous diet Continue present medications Awaiting pathology results Repeat colonoscopy for surveillance based on pathology results Handouts provided on polyps and hemorrhoids.  YOU HAD AN ENDOSCOPIC PROCEDURE TODAY AT THE [facility]:   Refer to the procedure report that was given to you for any specific questions about what was found during the examination.  If the procedure report does not answer your questions, please call your gastroenterologist to clarify.  If you requested that your care partner not be given the details of your procedure findings, then the procedure report has been included in a sealed envelope for you to review at your convenience later.  YOU SHOULD EXPECT: Some feelings of bloating in the abdomen. Passage of more gas than usual.  Walking can help get rid of the air that was put into your GI tract during the procedure and reduce the bloating. If you had a lower endoscopy (such as a colonoscopy or flexible sigmoidoscopy) you may notice spotting of blood in your stool or on the toilet paper. If you underwent a bowel prep for your procedure, you may not have a normal bowel movement for a few days.  Please Note:  You might notice some irritation and congestion in your nose or some drainage.  This is from the oxygen used during your procedure.  There is no need for concern and it should clear up in a day or so.  SYMPTOMS TO REPORT IMMEDIATELY:  Following lower endoscopy (colonoscopy or flexible sigmoidoscopy):  Excessive amounts of blood in the stool  Significant tenderness or worsening of abdominal pains  Swelling of the abdomen that is new, acute  Fever of 100F or higher  For urgent or emergent issues, a gastroenterologist can be reached at any hour by calling [phone]. Do not use MyChart messaging for urgent concerns.    DIET:  We do recommend a small meal at first, but then you may proceed to your regular diet.  Drink plenty of fluids but  you should avoid alcoholic beverages for 24 hours.  ACTIVITY:  You should plan to take it easy for the rest of today and you should NOT DRIVE or use heavy machinery until tomorrow (because of the sedation medicines used during the test).    FOLLOW UP: Our staff will call the number listed on your records the next business day following your procedure.  We will call around 7:15- 8:00 am to check on you and address any questions or concerns that you may have regarding the information given to you following your procedure. If we do not reach you, we will leave a message.     If any biopsies were taken you will be contacted by phone or by letter within the next 1-3 weeks.  Please call us  at [phone] if you have not heard about the biopsies in 3 weeks.    SIGNATURES/CONFIDENTIALITY: You and/or your care partner have signed paperwork which will be entered into your electronic medical record.  These signatures attest to the fact that that the information above on your After Visit Summary has been reviewed and is understood.  Full responsibility of the confidentiality of this discharge information lies with you and/or your care-partner.

## 2024-03-25 NOTE — Op Note (Signed)
 [facility] Patient Name: Joanne Robertson Procedure Date: 03/25/2024 2:39 PM MRN: [id number] Endoscopist: Inocente Hausen , MD, [id number] Age: 58 Referring MD:  Date of Birth: 06-Oct-1965 Gender: Female Account #: [id number] Procedure:                Colonoscopy Indications:              Screening for colorectal malignant neoplasm, Last                            colonoscopy: 2015 Medicines:                Monitored Anesthesia Care Procedure:                Pre-Anesthesia Assessment:                           - Prior to the procedure, a History and Physical                            was performed, and patient medications and                            allergies were reviewed. The patient's tolerance of                            previous anesthesia was also reviewed. The risks                            and benefits of the procedure and the sedation                            options and risks were discussed with the patient.                            All questions were answered, and informed consent                            was obtained. Prior Anticoagulants: The patient has                            taken no anticoagulant or antiplatelet agents. ASA                            Grade Assessment: II - A patient with mild systemic                            disease. After reviewing the risks and benefits,                            the patient was deemed in satisfactory condition to                            undergo the procedure.  After obtaining informed consent, the colonoscope                            was passed under direct vision. Throughout the                            procedure, the patient's blood pressure, pulse, and                            oxygen saturations were monitored continuously. The                            Olympus Scope SN [id number] was introduced through the                            anus and advanced to the terminal ileum.  The                            colonoscopy was performed without difficulty. The                            patient tolerated the procedure well. The quality                            of the bowel preparation was good. The terminal                            ileum, ileocecal valve, appendiceal orifice, and                            rectum were photographed. Scope In: 2:49:42 PM Scope Out: 3:03:35 PM Scope Withdrawal Time: 0 hours 9 minutes 38 seconds  Total Procedure Duration: 0 hours 13 minutes 53 seconds  Findings:                 Hemorrhoids were found on perianal exam.                           The digital rectal exam was normal. Pertinent                            negatives include normal sphincter tone and no                            palpable rectal lesions.                           A 6 mm polyp was found in the ascending colon. The                            polyp was sessile. The polyp was removed with a                            cold snare. Resection and retrieval were complete.  The terminal ileum appeared normal.                           Internal hemorrhoids were found during retroflexion. Complications:            No immediate complications. Estimated blood loss:                            Minimal. Estimated Blood Loss:     Estimated blood loss was minimal. Impression:               - Hemorrhoids found on perianal exam.                           - One 6 mm polyp in the ascending colon, removed                            with a cold snare. Resected and retrieved.                           - The examined portion of the ileum was normal.                           - Internal hemorrhoids. Recommendation:           - Discharge patient to home (ambulatory).                           - Await pathology results.                           - Repeat colonoscopy for surveillance based on                            pathology results.                            - The findings and recommendations were discussed                            with the patient's family.                           - Patient has a contact number available for                            emergencies. The signs and symptoms of potential                            delayed complications were discussed with the                            patient. Return to normal activities tomorrow.                            Written discharge instructions were provided to the  patient. Inocente Hausen, MD 03/25/2024 3:07:16 PM This report has been signed electronically.

## 2024-03-26 ENCOUNTER — Telehealth: Payer: Self-pay

## 2024-03-26 NOTE — Telephone Encounter (Signed)
Attempted to reach patient for post-procedure f/u call. No answer. Left message for her to please not hesitate to call if she has any questions/concerns regarding her care. 

## 2024-03-31 ENCOUNTER — Ambulatory Visit: Payer: Self-pay | Admitting: Pediatrics

## 2024-03-31 LAB — SURGICAL PATHOLOGY

## 2024-08-06 ENCOUNTER — Institutional Professional Consult (permissible substitution) (INDEPENDENT_AMBULATORY_CARE_PROVIDER_SITE_OTHER): Admitting: Physician Assistant
